# Patient Record
Sex: Male | Born: 1980 | Race: White | Hispanic: No | Marital: Single | Smoking: Former smoker
Health system: Southern US, Community
[De-identification: ages and names within clinical notes are randomized; demographics above are authoritative.]

## PROBLEM LIST (undated history)

## (undated) DIAGNOSIS — E669 Obesity, unspecified: Secondary | ICD-10-CM

## (undated) DIAGNOSIS — M1990 Unspecified osteoarthritis, unspecified site: Secondary | ICD-10-CM

## (undated) HISTORY — PX: FRACTURE SURGERY: SHX138

## (undated) HISTORY — PX: KNEE ARTHROSCOPY: SHX127

---
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
## 2008-10-18 HISTORY — PX: ANKLE FRACTURE SURGERY: SHX122

## 2015-01-21 ENCOUNTER — Emergency Department (HOSPITAL_COMMUNITY): Payer: Commercial Managed Care - HMO

## 2015-01-21 ENCOUNTER — Encounter (HOSPITAL_COMMUNITY): Payer: Self-pay | Admitting: Emergency Medicine

## 2015-01-21 ENCOUNTER — Other Ambulatory Visit (HOSPITAL_COMMUNITY): Payer: Self-pay

## 2015-01-21 ENCOUNTER — Inpatient Hospital Stay (HOSPITAL_COMMUNITY)
Admission: EM | Admit: 2015-01-21 | Discharge: 2015-01-24 | DRG: 871 | Disposition: A | Discharge status: Home / Self Care | Payer: Commercial Managed Care - HMO | Attending: Family Medicine | Admitting: Family Medicine

## 2015-01-21 DIAGNOSIS — Z720 Tobacco use: Secondary | ICD-10-CM | POA: Diagnosis not present

## 2015-01-21 DIAGNOSIS — R0602 Shortness of breath: Secondary | ICD-10-CM | POA: Diagnosis present

## 2015-01-21 DIAGNOSIS — Z7982 Long term (current) use of aspirin: Secondary | ICD-10-CM

## 2015-01-21 DIAGNOSIS — A419 Sepsis, unspecified organism: Principal | ICD-10-CM | POA: Diagnosis present

## 2015-01-21 DIAGNOSIS — J209 Acute bronchitis, unspecified: Secondary | ICD-10-CM | POA: Diagnosis not present

## 2015-01-21 DIAGNOSIS — F17210 Nicotine dependence, cigarettes, uncomplicated: Secondary | ICD-10-CM | POA: Diagnosis present

## 2015-01-21 DIAGNOSIS — R05 Cough: Secondary | ICD-10-CM

## 2015-01-21 DIAGNOSIS — Z21 Asymptomatic human immunodeficiency virus [HIV] infection status: Secondary | ICD-10-CM | POA: Diagnosis present

## 2015-01-21 DIAGNOSIS — J9601 Acute respiratory failure with hypoxia: Secondary | ICD-10-CM | POA: Diagnosis present

## 2015-01-21 DIAGNOSIS — R059 Cough, unspecified: Secondary | ICD-10-CM | POA: Diagnosis present

## 2015-01-21 DIAGNOSIS — R778 Other specified abnormalities of plasma proteins: Secondary | ICD-10-CM | POA: Diagnosis present

## 2015-01-21 DIAGNOSIS — T380X5A Adverse effect of glucocorticoids and synthetic analogues, initial encounter: Secondary | ICD-10-CM | POA: Diagnosis present

## 2015-01-21 DIAGNOSIS — J189 Pneumonia, unspecified organism: Secondary | ICD-10-CM

## 2015-01-21 DIAGNOSIS — I10 Essential (primary) hypertension: Secondary | ICD-10-CM | POA: Diagnosis present

## 2015-01-21 DIAGNOSIS — Z6841 Body Mass Index (BMI) 40.0 and over, adult: Secondary | ICD-10-CM

## 2015-01-21 DIAGNOSIS — R7989 Other specified abnormal findings of blood chemistry: Secondary | ICD-10-CM | POA: Diagnosis not present

## 2015-01-21 DIAGNOSIS — L02214 Cutaneous abscess of groin: Secondary | ICD-10-CM | POA: Diagnosis present

## 2015-01-21 DIAGNOSIS — R739 Hyperglycemia, unspecified: Secondary | ICD-10-CM

## 2015-01-21 DIAGNOSIS — J219 Acute bronchiolitis, unspecified: Secondary | ICD-10-CM | POA: Diagnosis present

## 2015-01-21 DIAGNOSIS — I248 Other forms of acute ischemic heart disease: Secondary | ICD-10-CM | POA: Diagnosis present

## 2015-01-21 DIAGNOSIS — L732 Hidradenitis suppurativa: Secondary | ICD-10-CM | POA: Diagnosis present

## 2015-01-21 DIAGNOSIS — R609 Edema, unspecified: Secondary | ICD-10-CM

## 2015-01-21 DIAGNOSIS — J40 Bronchitis, not specified as acute or chronic: Secondary | ICD-10-CM | POA: Diagnosis present

## 2015-01-21 HISTORY — DX: Obesity, unspecified: E66.9

## 2015-01-21 HISTORY — DX: Unspecified osteoarthritis, unspecified site: M19.90

## 2015-01-21 LAB — TROPONIN I
Troponin I: 0.05 ng/mL — ABNORMAL HIGH (ref <0.031)
Troponin I: 0.04 ng/mL — ABNORMAL HIGH (ref <0.031)
Troponin I: 0.03 ng/mL (ref <0.031)
Troponin I: 0.03 ng/mL (ref <0.031)

## 2015-01-21 LAB — CBC WITH DIFFERENTIAL/PLATELET
BASOS ABS: 0 10*3/uL (ref 0.0–0.1)
Basophils Relative: 0 % (ref 0–1)
Eosinophils Absolute: 0 10*3/uL (ref 0.0–0.7)
Eosinophils Relative: 0 % (ref 0–5)
HCT: 46.2 % (ref 39.0–52.0)
Hemoglobin: 15.3 g/dL (ref 13.0–17.0)
LYMPHS PCT: 5 % — AB (ref 12–46)
Lymphs Abs: 1 10*3/uL (ref 0.7–4.0)
MCH: 31.4 pg (ref 26.0–34.0)
MCHC: 33.1 g/dL (ref 30.0–36.0)
MCV: 94.7 fL (ref 78.0–100.0)
Monocytes Absolute: 1.1 10*3/uL — ABNORMAL HIGH (ref 0.1–1.0)
Monocytes Relative: 5 % (ref 3–12)
NEUTROS PCT: 90 % — AB (ref 43–77)
Neutro Abs: 19.5 10*3/uL — ABNORMAL HIGH (ref 1.7–7.7)
PLATELETS: 277 10*3/uL (ref 150–400)
RBC: 4.88 MIL/uL (ref 4.22–5.81)
RDW: 12.9 % (ref 11.5–15.5)
WBC: 21.7 10*3/uL — ABNORMAL HIGH (ref 4.0–10.5)

## 2015-01-21 LAB — RAPID URINE DRUG SCREEN, HOSP PERFORMED
Amphetamines: NOT DETECTED
BENZODIAZEPINES: NOT DETECTED
Barbiturates: NOT DETECTED
COCAINE: NOT DETECTED
Opiates: NOT DETECTED
Tetrahydrocannabinol: NOT DETECTED

## 2015-01-21 LAB — BASIC METABOLIC PANEL
Anion gap: 4 — ABNORMAL LOW (ref 5–15)
BUN: 8 mg/dL (ref 6–23)
CHLORIDE: 97 mmol/L (ref 96–112)
CO2: 34 mmol/L — AB (ref 19–32)
Calcium: 8.9 mg/dL (ref 8.4–10.5)
Creatinine, Ser: 0.81 mg/dL (ref 0.50–1.35)
GFR calc Af Amer: >90 mL/min (ref >90)
GFR calc non Af Amer: >90 mL/min (ref >90)
Glucose, Bld: 146 mg/dL — ABNORMAL HIGH (ref 70–99)
Potassium: 4.4 mmol/L (ref 3.5–5.1)
Sodium: 135 mmol/L (ref 135–145)

## 2015-01-21 LAB — PROTIME-INR
INR: 1.22 (ref 0.00–1.49)
PROTHROMBIN TIME: 15.6 s — AB (ref 11.6–15.2)

## 2015-01-21 LAB — D-DIMER, QUANTITATIVE: D-Dimer, Quant: 0.42 ug/mL-FEU (ref 0.00–0.48)

## 2015-01-21 LAB — INFLUENZA PANEL BY PCR (TYPE A & B)
H1N1FLUPCR: NOT DETECTED
INFLBPCR: NEGATIVE
Influenza A By PCR: NEGATIVE

## 2015-01-21 LAB — BRAIN NATRIURETIC PEPTIDE: B NATRIURETIC PEPTIDE 5: 71.1 pg/mL (ref 0.0–100.0)

## 2015-01-21 LAB — I-STAT CG4 LACTIC ACID, ED: LACTIC ACID, VENOUS: 1.96 mmol/L (ref 0.5–2.0)

## 2015-01-21 MED ORDER — LEVALBUTEROL HCL 1.25 MG/0.5ML IN NEBU
1.2500 mg | INHALATION_SOLUTION | Freq: Four times a day (QID) | RESPIRATORY_TRACT | Status: DC | PRN
  Filled 2015-01-21: qty 0.5

## 2015-01-21 MED ORDER — SODIUM CHLORIDE 0.9 % IJ SOLN
3.0000 mL | Freq: Two times a day (BID) | INTRAMUSCULAR | Status: DC
  Administered 2015-01-21: 3 mL via INTRAVENOUS

## 2015-01-21 MED ORDER — DM-GUAIFENESIN ER 30-600 MG PO TB12
1.0000 | ORAL_TABLET | Freq: Two times a day (BID) | ORAL | Status: DC
  Administered 2015-01-21 – 2015-01-24 (×7): 1 via ORAL
  Filled 2015-01-21 (×9): qty 1

## 2015-01-21 MED ORDER — ACETAMINOPHEN 325 MG PO TABS: 650.0000 mg | ORAL_TABLET | Freq: Four times a day (QID) | ORAL | Status: DC | PRN

## 2015-01-21 MED ORDER — IPRATROPIUM-ALBUTEROL 0.5-2.5 (3) MG/3ML IN SOLN
3.0000 mL | RESPIRATORY_TRACT | Status: DC
  Administered 2015-01-21 – 2015-01-23 (×14): 3 mL via RESPIRATORY_TRACT
  Filled 2015-01-21 (×14): qty 3

## 2015-01-21 MED ORDER — ASPIRIN EC 81 MG PO TBEC
81.0000 mg | DELAYED_RELEASE_TABLET | Freq: Every day | ORAL | Status: DC
  Administered 2015-01-21 – 2015-01-24 (×4): 81 mg via ORAL
  Filled 2015-01-21 (×4): qty 1

## 2015-01-21 MED ORDER — ONDANSETRON HCL 4 MG/2ML IJ SOLN: 4.0000 mg | Freq: Four times a day (QID) | INTRAMUSCULAR | Status: DC | PRN

## 2015-01-21 MED ORDER — METHYLPREDNISOLONE SODIUM SUCC 125 MG IJ SOLR
125.0000 mg | Freq: Once | INTRAMUSCULAR | Status: AC
  Administered 2015-01-21: 125 mg via INTRAVENOUS
  Filled 2015-01-21: qty 2

## 2015-01-21 MED ORDER — ALUM & MAG HYDROXIDE-SIMETH 200-200-20 MG/5ML PO SUSP: 30.0000 mL | Freq: Four times a day (QID) | ORAL | Status: DC | PRN

## 2015-01-21 MED ORDER — ALBUTEROL SULFATE (2.5 MG/3ML) 0.083% IN NEBU: 2.5000 mg | INHALATION_SOLUTION | RESPIRATORY_TRACT | Status: AC | PRN

## 2015-01-21 MED ORDER — HEPARIN SODIUM (PORCINE) 5000 UNIT/ML IJ SOLN
5000.0000 [IU] | Freq: Three times a day (TID) | INTRAMUSCULAR | Status: DC
  Administered 2015-01-21 – 2015-01-24 (×9): 5000 [IU] via SUBCUTANEOUS
  Filled 2015-01-21 (×11): qty 1

## 2015-01-21 MED ORDER — IPRATROPIUM-ALBUTEROL 0.5-2.5 (3) MG/3ML IN SOLN
3.0000 mL | Freq: Once | RESPIRATORY_TRACT | Status: AC
  Administered 2015-01-21: 3 mL via RESPIRATORY_TRACT
  Filled 2015-01-21: qty 3

## 2015-01-21 MED ORDER — ACETAMINOPHEN 650 MG RE SUPP: 650.0000 mg | Freq: Four times a day (QID) | RECTAL | Status: DC | PRN

## 2015-01-21 MED ORDER — SODIUM CHLORIDE 0.9 % IV SOLN
INTRAVENOUS | Status: DC
  Administered 2015-01-21: 75 mL/h via INTRAVENOUS
  Administered 2015-01-22: 1000 mL via INTRAVENOUS
  Administered 2015-01-22 – 2015-01-24 (×2): via INTRAVENOUS

## 2015-01-21 MED ORDER — ONDANSETRON HCL 4 MG PO TABS: 4.0000 mg | ORAL_TABLET | Freq: Four times a day (QID) | ORAL | Status: DC | PRN

## 2015-01-21 MED ORDER — HYDRALAZINE HCL 20 MG/ML IJ SOLN
10.0000 mg | Freq: Four times a day (QID) | INTRAMUSCULAR | Status: DC | PRN
  Administered 2015-01-23: 10 mg via INTRAVENOUS
  Filled 2015-01-21: qty 1

## 2015-01-21 MED ORDER — DOXYCYCLINE HYCLATE 100 MG PO TABS
100.0000 mg | ORAL_TABLET | Freq: Two times a day (BID) | ORAL | Status: DC
  Administered 2015-01-21 – 2015-01-22 (×4): 100 mg via ORAL
  Filled 2015-01-21 (×6): qty 1

## 2015-01-21 MED ORDER — NICOTINE 21 MG/24HR TD PT24
21.0000 mg | MEDICATED_PATCH | Freq: Every day | TRANSDERMAL | Status: DC
  Filled 2015-01-21 (×4): qty 1

## 2015-01-21 MED ORDER — METHYLPREDNISOLONE SODIUM SUCC 125 MG IJ SOLR
60.0000 mg | Freq: Three times a day (TID) | INTRAMUSCULAR | Status: DC
  Administered 2015-01-21 – 2015-01-22 (×3): 60 mg via INTRAVENOUS
  Filled 2015-01-21 (×2): qty 2
  Filled 2015-01-21: qty 0.96
  Filled 2015-01-21: qty 2
  Filled 2015-01-21 (×2): qty 0.96

## 2015-01-21 NOTE — ED Notes (Signed)
Glick at bedside.  

## 2015-01-21 NOTE — ED Notes (Addendum)
Attempted report 

## 2015-01-21 NOTE — Progress Notes (Signed)
Switched pt to  at 6L to see how he would tolerate. Pt denies SOB at this time and states he is ready to go home. He states he smokes 1 pk a day and is "always wheezy".

## 2015-01-21 NOTE — H&P (Signed)
[facility] History and Physical  Christian Woods WUJ:[id number] DOB: 01/25/1981 DOA: 01/21/2015  Referring physician: ED physician PCP: Pcp Not In System  Specialists:   Chief Complaint: dry cough and SOB  HPI: Christian Woods is a [age] y.o. male with past medical history of tobacco abuse, who presents with dry cough and shortness of breath.  Patient reports and he started having dry cough and shortness of breath since yesterday morning. He also has wheezing, no fever, chills, chest pain. He took Delsym and Zyrtec-D which stopped the cough, but he has developed a severe dyspnea. Dyspnea is worse with laying flat and with exertion.   Patient denies fever, chills, fatigue, headaches, chest pain, abdominal pain, diarrhea, constipation, dysuria, urgency, frequency, hematuria, skin rashes, joint pain. He reports having ankle edema from recent traveling. He drove to [place] and drove back two weeks ago. No tenderness over the calf areas. No unilateral weakness, numbness or tingling sensations. No vision change or hearing loss.  In ED, patient was found to have negative chest x-ray for acute abnormalities. Lactate 1.96, troponin elevation 0.05, BNP is 71.1.Temperature normal. Patient is admitted to inpatient for further evaluation and treatment.  Review of Systems: As presented in the history of presenting illness, rest negative.  Where does patient live?  At home Can patient participate in ADLs? Yes  Allergy: No Known Allergies  History reviewed. No pertinent past medical history.  Past Surgical History  Procedure Laterality Date  . Knee surgery    . Ankle surgery      Social History:  reports that he has been smoking Cigarettes.  He has been smoking about 1.50 packs per day. He does not have any smokeless tobacco history on file. He reports that he drinks about 21.6 oz of alcohol per week. He reports that he does not use illicit drugs.  Family History:  Family History  Problem  Relation Age of Onset  . Breast cancer    . Hypertension Mother      Prior to Admission medications   Medication Sig Start Date End Date Taking? Authorizing Provider  ibuprofen (ADVIL,MOTRIN) 200 MG tablet Take 400 mg by mouth every 6 (six) hours as needed for fever or moderate pain.   Yes Historical Provider, MD    Physical Exam: Filed Vitals:   01/21/15 0315 01/21/15 0328 01/21/15 0330 01/21/15 0454  BP: 177/100  168/98 160/94  Pulse: 131 124 123 113  Temp:      TempSrc:      Resp: 34 35 27 18  Height:      Weight:      SpO2: 96% 95% 95% 95%   General: Not in acute distress HEENT:       Eyes: PERRL, EOMI, no scleral icterus       ENT: No discharge from the ears and nose, no pharynx injection, no tonsillar enlargement.        Neck: No JVD, no bruit, no mass felt. Cardiac: S1/S2, RRR, No murmurs, No gallops or rubs Pulm: decreased air movement bilaterally. Diffused wheezing bilaterally, no rales. Abd: Soft, nondistended, nontender, no rebound pain, no organomegaly, BS present Ext: No edema bilaterally. 2+DP/PT pulse bilaterally Musculoskeletal: No joint deformities, erythema, or stiffness, ROM full Skin: No rashes.  Neuro: Alert and oriented X3, cranial nerves II-XII grossly intact, muscle strength 5/5 in all extremeties, sensation to light touch intact.  Psych: Patient is not psychotic, no suicidal or hemocidal ideation.  Labs on Admission:  Basic Metabolic Panel:  Recent Labs Lab  01/21/15 0323  NA 135  K 4.4  CL 97  CO2 34*  GLUCOSE 146*  BUN 8  CREATININE 0.81  CALCIUM 8.9   Liver Function Tests: No results for input(s): AST, ALT, ALKPHOS, BILITOT, PROT, ALBUMIN in the last 168 hours. No results for input(s): LIPASE, AMYLASE in the last 168 hours. No results for input(s): AMMONIA in the last 168 hours. CBC:  Recent Labs Lab 01/21/15 0323  WBC 21.7*  NEUTROABS 19.5*  HGB 15.3  HCT 46.2  MCV 94.7  PLT 277   Cardiac Enzymes:  Recent Labs Lab  01/21/15 0323  TROPONINI 0.05*    BNP (last 3 results)  Recent Labs  01/21/15 0323  BNP 71.1    ProBNP (last 3 results) No results for input(s): PROBNP in the last 8760 hours.  CBG: No results for input(s): GLUCAP in the last 168 hours.  Radiological Exams on Admission: Dg Chest Portable 1 View  01/21/2015   CLINICAL DATA:  Shortness of breath, fever, tachycardia, history smoking  EXAM: PORTABLE CHEST - 1 VIEW  COMPARISON:  Portable exam 0320 hr without priors for comparison.  FINDINGS: Normal heart size, mediastinal contours, and pulmonary vascularity.  Lungs clear.  No pleural effusion or pneumothorax.  Osseous structures unremarkable.  IMPRESSION: No acute abnormalities.   Electronically Signed   By: Ulyses Southward M.D.   On: 01/21/2015 03:32    EKG: Independently reviewed.   Assessment/Plan Principal Problem:   Acute bronchitis with bronchospasm Active Problems:   SOB (shortness of breath)   Cough   Bronchitis   Tobacco abuse   Elevated troponin I level  Acute bronchitis: Patient's cough and shortness of breath are most likely caused by acute bronchitis. Given recent history of prolonged recent traveling, will rule out pulmonary embolism though less likely given not any chest pain. -will admit to tele bed -Nebulizer: Xopenex every 6 hours when necessary, DuoNeb every 4 hours -solumedrol: 60 mg q8h -Mucinex for cough  -Urine drug screen, HIV -respiratory virus panel, Flu pcr -check D-dimer to r/o PE  Elevated trop: trop is 0.05. No chest pain. EKG has no ischemic change. It is most likely due to demanding ischemia secondary to tachycardia. -Troponin 3 -Aspirin 81 mg daily -repeat EKG.  Tobacco abuse: -Nicotine patch   DVT ppx: SQ Heparin         Code Status: Full code Family Communication: None at bed side.            Disposition Plan: Admit to inpatient   Date of Service 01/21/2015    Lorretta Harp [facility] Pager [phone]  If 7PM-7AM, please  contact night-coverage [email or web address] Password [facility] 01/21/2015, 5:36 AM

## 2015-01-21 NOTE — ED Notes (Signed)
Per EMS: woke up yesterday am, had a lot of non prod cough.  Bout 1230 he took zyrtic D, at 1500 he had difficulty breathing.  At 2200 he felt extremely short of breath, breaking out in sweat, never had pain.  On EMS arrival, FD had patient on NRB 15 L and patient was 96%, expiratory wheezing.  260/142, no hx of hypertension.  Has had high blood pressure before when taking decongestants.  Has had total 10 mg alb, 0.5 Atrovent.  Patient feels like breathing is better, talking in complete sentences, still wheezing.  spo2 94 % on nebulizer.. No med hx, no allergies, does not take any meds.

## 2015-01-21 NOTE — Progress Notes (Signed)
[place], will await for pt. To arrive to 5W31 from ED.  Forbes Cellarelcine Syan Cullimore, RN

## 2015-01-21 NOTE — ED Notes (Signed)
Ordered regular breakfast tray for pt.  

## 2015-01-21 NOTE — ED Notes (Signed)
Admitting MD states that parameters are 90% and above for spO2. Pt on 6L at 92% at this time.

## 2015-01-21 NOTE — ED Provider Notes (Signed)
CSN: [id number]     Arrival date & time 01/21/15  0254 History   First MD Initiated Contact with Patient 01/21/15 0310     Chief Complaint  Patient presents with  . Shortness of Breath     (Consider location/radiation/quality/duration/timing/severity/associated sxs/prior Treatment) Patient is a 34 y.o. male presenting with shortness of breath. The history is provided by the patient.  Shortness of Breath He started having a cough this morning. Cough was nonproductive. He took Delsym and Zyrtec-D which stopped the cough, but he has developed a severe dyspnea. Dyspnea is worse with laying flat and with exertion. He denies chest pain, heaviness, tightness, pressure. He denies fever, chills, sweats. He was brought in by ambulance where he was given albuterol and ipratropium via nebulizer with partial improvement. He states he is feeling better although he is noted to still be dyspneic. He is a one pack a day smoker, and admits to history of bronchitis in the past but has never had to be hospitalized for that.  History reviewed. No pertinent past medical history. Past Surgical History  Procedure Laterality Date  . Knee surgery    . Ankle surgery     No family history on file. History  Substance Use Topics  . Smoking status: Heavy Tobacco Smoker -- 1.50 packs/day    Types: Cigarettes  . Smokeless tobacco: Not on file  . Alcohol Use: 21.6 oz/week    36 Cans of beer per week    Review of Systems  Respiratory: Positive for shortness of breath.   All other systems reviewed and are negative.     Allergies  Review of patient's allergies indicates no known allergies.  Home Medications   Prior to Admission medications   Not on File   BP 220/129 mmHg  Pulse 129  Temp(Src) 98.7 F (37.1 C) (Oral)  Resp 32  Ht  (1.778 m)  Wt 270 lb (122.471 kg)  BMI 38.74 kg/m2  SpO2 94% Physical Exam  Nursing note and vitals reviewed.  34 year old male, resting comfortably and in no acute  distress. Vital signs are significant for tachycardia, tachypnea, and hypertension. Oxygen saturation is 94%, which is normal. Head is normocephalic and atraumatic. PERRLA, EOMI. Oropharynx is clear. Neck is nontender and supple without adenopathy or JVD. Back is nontender and there is no CVA tenderness. Lungs have diminished air flow with diffuse expiratory wheezes. There are no rales or rhonchi. There is no use of accessory muscles of respiration. Chest is nontender. Heart has regular rate and rhythm without murmur. Abdomen is soft, flat, nontender without masses or hepatosplenomegaly and peristalsis is normoactive. Extremities have 1+ edema, full range of motion is present. Skin is warm and dry without rash. Neurologic: Mental status is normal, cranial nerves are intact, there are no motor or sensory deficits.  ED Course  Procedures (including critical care time) Labs Review Results for orders placed or performed during the hospital encounter of 01/21/15  Basic metabolic panel  Result Value Ref Range   Sodium 135 135 - 145 mmol/L   Potassium 4.4 3.5 - 5.1 mmol/L   Chloride 97 96 - 112 mmol/L   CO2 34 (H) 19 - 32 mmol/L   Glucose, Bld 146 (H) 70 - 99 mg/dL   BUN 8 6 - 23 mg/dL   Creatinine, Ser 4.09 0.50 - 1.35 mg/dL   Calcium 8.9 8.4 - 81.1 mg/dL   GFR calc non Af Amer >90 >90 mL/min   GFR calc Af Amer >90 >  90 mL/min   Anion gap 4 (L) 5 - 15  Brain natriuretic peptide  Result Value Ref Range   B Natriuretic Peptide 71.1 0.0 - 100.0 pg/mL  Troponin I  Result Value Ref Range   Troponin I 0.05 (H) <0.031 ng/mL  CBC with Differential  Result Value Ref Range   WBC 21.7 (H) 4.0 - 10.5 K/uL   RBC 4.88 4.22 - 5.81 MIL/uL   Hemoglobin 15.3 13.0 - 17.0 g/dL   HCT 40.9 81.1 - 91.4 %   MCV 94.7 78.0 - 100.0 fL   MCH 31.4 26.0 - 34.0 pg   MCHC 33.1 30.0 - 36.0 g/dL   RDW 78.2 95.6 - 21.3 %   Platelets 277 150 - 400 K/uL   Neutrophils Relative % 90 (H) 43 - 77 %   Neutro Abs 19.5  (H) 1.7 - 7.7 K/uL   Lymphocytes Relative 5 (L) 12 - 46 %   Lymphs Abs 1.0 0.7 - 4.0 K/uL   Monocytes Relative 5 3 - 12 %   Monocytes Absolute 1.1 (H) 0.1 - 1.0 K/uL   Eosinophils Relative 0 0 - 5 %   Eosinophils Absolute 0.0 0.0 - 0.7 K/uL   Basophils Relative 0 0 - 1 %   Basophils Absolute 0.0 0.0 - 0.1 K/uL  I-Stat CG4 Lactic Acid, ED  Result Value Ref Range   Lactic Acid, Venous 1.96 0.5 - 2.0 mmol/L   Imaging Review Dg Chest Portable 1 View  01/21/2015   CLINICAL DATA:  Shortness of breath, fever, tachycardia, history smoking  EXAM: PORTABLE CHEST - 1 VIEW  COMPARISON:  Portable exam 0320 hr without priors for comparison.  FINDINGS: Normal heart size, mediastinal contours, and pulmonary vascularity.  Lungs clear.  No pleural effusion or pneumothorax.  Osseous structures unremarkable.  IMPRESSION: No acute abnormalities.   Electronically Signed   By: Ulyses Southward M.D.   On: 01/21/2015 03:32     EKG Interpretation   Date/Time:  Tuesday January 21 2015 03:11:38 EDT Ventricular Rate:  129 PR Interval:  107 QRS Duration: 87 QT Interval:  328 QTC Calculation: 480 R Axis:   76 Text Interpretation:  Sinus tachycardia Borderline T abnormalities,  diffuse leads Borderline prolonged QT interval No old tracing to compare  Confirmed by [facility]  MD, Raziya Aveni ([id number]) on 01/21/2015 3:19:35 AM      CRITICAL CARE Performed by: QIONG,EXBMW Total critical care time: 45 minutes Critical care time was exclusive of separately billable procedures and treating other patients. Critical care was necessary to treat or prevent imminent or life-threatening deterioration. Critical care was time spent personally by me on the following activities: development of treatment plan with patient and/or surrogate as well as nursing, discussions with consultants, evaluation of patient's response to treatment, examination of patient, obtaining history from patient or surrogate, ordering and performing treatments and  interventions, ordering and review of laboratory studies, ordering and review of radiographic studies, pulse oximetry and re-evaluation of patient's condition.  MDM   Final diagnoses:  Acute bronchiolitis with bronchospasm  Hyperglycemia  Elevated troponin I level    Cough with bronchospasm, rule out pneumonia. Chest x-ray has been ordered and he will be given an additional albuterol with ipratropium nebulizer treatment as well as a dose of methylprednisolone. It is noted that he has peripheral edema. BNP will be checked. There are no prior records and the [facility].  There is only modest improvement with additional albuterol with ipratropium. WBC is markedly elevated, but there  is no obvious source of infection. Bone and is slightly elevated and this is felt to represent demand ischemia. No evidence of acute STEMI. Case is discussed with Dr. Clyde LundborgNiu [facility] who agrees to admit the patient.  Dione Boozeavid Izola Teague, MD 01/21/15 [id number]

## 2015-01-21 NOTE — Plan of Care (Signed)
Problem: Phase I Progression Outcomes Goal: Initial discharge plan identified Outcome: Completed/Met Date Met:  01/21/15 To return home alone

## 2015-01-21 NOTE — Progress Notes (Addendum)
Patient admitted after midnight, please see H&P. Acute bronchitis: Patient's cough and shortness of breath are most likely caused by acute bronchitis from smoking -Nebulizer: Xopenex every 6 hours when necessary, DuoNeb every 4 hours -solumedrol: 60 mg q8h -Mucinex for cough  -Urine drug screen neg, HIV - D-dimer ok  Elevated trop: trop is 0.05. No chest pain. EKG has no ischemic change. It is most likely due to demanding ischemia secondary to tachycardia. -Troponin 3 -Aspirin 81 mg daily  Tobacco abuse: -Nicotine patch- patient not interested  Chronic groin mass- chronic hidradenitis- 3 weeks of doxy Large right side- patient says drains WBC count  Morbid obesity- encourage weight loss  Sinus tach  Marlin CanaryJessica Telma Pyeatt DO

## 2015-01-21 NOTE — Consult Note (Signed)
[facility] Surgery Consult Note  Christian Woods Apr 28, 1981  [id number]  Requesting MD: Dr. Eliseo Squires Chief Complaint/Reason for Consult: Groin abscesses  HPI:  34 y/o white male present to [facility] with dry cough, SOB and chronic draining groin abscesses.  We were asked to see him for his groin abscesses.   He's had trouble with them since high school.  He gets the abscesses in his groin, axilla, buttock, perineum, and posterior neck.  He says the start small gradually enlarge and eventually the rupture on their own.  He often has to puncture them with a needle and squeeze the pus out as "a last ditch effort" and to prevent it rupturing it while he's at work.  No N/V, no abdominal pain, fever/chills, no trouble urinating or having BM's.  He states he's concerned about this problem and wants resolution.  He's seen a few dermatologist in the past who diagnosed him with "severe acne".  He was started on accutane which helped some of the lesions, but not the big ones.  He's also been on antibiotics in the past.  Most recently saw Dr. Elvera Lennox a dermatologist in [place].  He c/o chronic swelling in his suprapubic region.  Patient notes pain only when abscesses enlarge and don't rupture.  No radiating pain, no perceived precipitating/alleviating factors.  Pt has a BMI of 38, no know DM.    ROS: All systems reviewed and otherwise negative except for as above  Family History  Problem Relation Age of Onset  . Breast cancer    . Hypertension Mother     History reviewed. No pertinent past medical history.  Past Surgical History  Procedure Laterality Date  . Knee surgery    . Ankle surgery      Social History:  reports that he has been smoking Cigarettes.  He has been smoking about 1.50 packs per day. He does not have any smokeless tobacco history on file. He reports that he drinks about 21.6 oz of alcohol per week. He reports that he does not use illicit drugs.  Allergies: No Known  Allergies   (Not in a hospital admission)  Blood pressure 159/94, pulse 112, temperature 99.2 F (37.3 C), temperature source Oral, resp. rate 18, height 5' 10"  (1.778 m), weight 122.471 kg (270 lb), SpO2 92 %. Physical Exam: General: pleasant, WD/WN white male who is laying in bed in NAD HEENT: head is normocephalic, atraumatic.  Sclera are noninjected.  PERRL.  Ears and nose without any masses or lesions.  Mouth is pink and moist Heart: regular, rate, and rhythm.  No obvious murmurs, gallops, or rubs noted.  Palpable pedal pulses bilaterally Lungs: CTAB, no wheezes, rhonchi, or rales noted.  Respiratory effort nonlabored Abd: soft, NT/ND, +BS, no masses, hernias, or organomegaly MS: all 4 extremities are symmetrical with no cyanosis, clubbing, or edema. Skin: warm and dry, multiple abscesses in the b/l groin, with significant chronic skin changes b/l groins, multiple draining abscesses, no significant un-drained abscesses, minor erythema Psych: A&Ox3 with an appropriate affect.   Results for orders placed or performed during the hospital encounter of 01/21/15 (from the past 48 hour(s))  Basic metabolic panel     Status: Abnormal   Collection Time: 01/21/15  3:23 AM  Result Value Ref Range   Sodium 135 135 - 145 mmol/L   Potassium 4.4 3.5 - 5.1 mmol/L   Chloride 97 96 - 112 mmol/L   CO2 34 (H) 19 - 32 mmol/L   Glucose, Bld 146 (H) 70 -  99 mg/dL   BUN 8 6 - 23 mg/dL   Creatinine, Ser 0.81 0.50 - 1.35 mg/dL   Calcium 8.9 8.4 - 10.5 mg/dL   GFR calc non Af Amer >90 >90 mL/min   GFR calc Af Amer >90 >90 mL/min    Comment: (NOTE) The eGFR has been calculated using the CKD EPI equation. This calculation has not been validated in all clinical situations. eGFR's persistently <90 mL/min signify possible Chronic Kidney Disease.    Anion gap 4 (L) 5 - 15  Brain natriuretic peptide     Status: None   Collection Time: 01/21/15  3:23 AM  Result Value Ref Range   B Natriuretic Peptide  71.1 0.0 - 100.0 pg/mL  Troponin I     Status: Abnormal   Collection Time: 01/21/15  3:23 AM  Result Value Ref Range   Troponin I 0.05 (H) <0.031 ng/mL    Comment:        PERSISTENTLY INCREASED TROPONIN VALUES IN THE RANGE OF 0.04-0.49 ng/mL CAN BE SEEN IN:       -UNSTABLE ANGINA       -CONGESTIVE HEART FAILURE       -MYOCARDITIS       -CHEST TRAUMA       -ARRYHTHMIAS       -LATE PRESENTING MYOCARDIAL INFARCTION       -COPD   CLINICAL FOLLOW-UP RECOMMENDED.   CBC with Differential     Status: Abnormal   Collection Time: 01/21/15  3:23 AM  Result Value Ref Range   WBC 21.7 (H) 4.0 - 10.5 K/uL   RBC 4.88 4.22 - 5.81 MIL/uL   Hemoglobin 15.3 13.0 - 17.0 g/dL   HCT 46.2 39.0 - 52.0 %   MCV 94.7 78.0 - 100.0 fL   MCH 31.4 26.0 - 34.0 pg   MCHC 33.1 30.0 - 36.0 g/dL   RDW 12.9 11.5 - 15.5 %   Platelets 277 150 - 400 K/uL   Neutrophils Relative % 90 (H) 43 - 77 %   Neutro Abs 19.5 (H) 1.7 - 7.7 K/uL   Lymphocytes Relative 5 (L) 12 - 46 %   Lymphs Abs 1.0 0.7 - 4.0 K/uL   Monocytes Relative 5 3 - 12 %   Monocytes Absolute 1.1 (H) 0.1 - 1.0 K/uL   Eosinophils Relative 0 0 - 5 %   Eosinophils Absolute 0.0 0.0 - 0.7 K/uL   Basophils Relative 0 0 - 1 %   Basophils Absolute 0.0 0.0 - 0.1 K/uL  I-Stat CG4 Lactic Acid, ED     Status: None   Collection Time: 01/21/15  3:36 AM  Result Value Ref Range   Lactic Acid, Venous 1.96 0.5 - 2.0 mmol/L  Troponin I (q 6hr x 3)     Status: Abnormal   Collection Time: 01/21/15 11:23 AM  Result Value Ref Range   Troponin I 0.04 (H) <0.031 ng/mL    Comment:        PERSISTENTLY INCREASED TROPONIN VALUES IN THE RANGE OF 0.04-0.49 ng/mL CAN BE SEEN IN:       -UNSTABLE ANGINA       -CONGESTIVE HEART FAILURE       -MYOCARDITIS       -CHEST TRAUMA       -ARRYHTHMIAS       -LATE PRESENTING MYOCARDIAL INFARCTION       -COPD   CLINICAL FOLLOW-UP RECOMMENDED.   Protime-INR     Status: Abnormal   Collection Time: 01/21/15  11:23 AM  Result Value  Ref Range   Prothrombin Time 15.6 (H) 11.6 - 15.2 seconds   INR 1.22 0.00 - 1.49  D-dimer, quantitative     Status: None   Collection Time: 01/21/15 11:23 AM  Result Value Ref Range   D-Dimer, Quant 0.42 0.00 - 0.48 ug/mL-FEU    Comment:        AT THE INHOUSE ESTABLISHED CUTOFF VALUE OF 0.48 ug/mL FEU, THIS ASSAY HAS BEEN DOCUMENTED IN THE LITERATURE TO HAVE A SENSITIVITY AND NEGATIVE PREDICTIVE VALUE OF AT LEAST 98 TO 99%.  THE TEST RESULT SHOULD BE CORRELATED WITH AN ASSESSMENT OF THE CLINICAL PROBABILITY OF DVT / VTE.   Urine rapid drug screen (hosp performed)     Status: None   Collection Time: 01/21/15 12:36 PM  Result Value Ref Range   Opiates NONE DETECTED NONE DETECTED   Cocaine NONE DETECTED NONE DETECTED   Benzodiazepines NONE DETECTED NONE DETECTED   Amphetamines NONE DETECTED NONE DETECTED   Tetrahydrocannabinol NONE DETECTED NONE DETECTED   Barbiturates NONE DETECTED NONE DETECTED    Comment:        DRUG SCREEN FOR MEDICAL PURPOSES ONLY.  IF CONFIRMATION IS NEEDED FOR ANY PURPOSE, NOTIFY LAB WITHIN 5 DAYS.        LOWEST DETECTABLE LIMITS FOR URINE DRUG SCREEN Drug Class       Cutoff (ng/mL) Amphetamine      1000 Barbiturate      200 Benzodiazepine   197 Tricyclics       588 Opiates          300 Cocaine          300 THC              50    Dg Chest Portable 1 View  01/21/2015   CLINICAL DATA:  Shortness of breath, fever, tachycardia, history smoking  EXAM: PORTABLE CHEST - 1 VIEW  COMPARISON:  Portable exam 0320 hr without priors for comparison.  FINDINGS: Normal heart size, mediastinal contours, and pulmonary vascularity.  Lungs clear.  No pleural effusion or pneumothorax.  Osseous structures unremarkable.  IMPRESSION: No acute abnormalities.   Electronically Signed   By: Lavonia Dana M.D.   On: 01/21/2015 03:32    Assessment/Plan Chronic groin hidradenitis  -Chronic in nature, no acute un-drained abscesses, most abscesses are spontaneously  draining  -Doxycycline for 3 weeks to calm down the infection -Avoid popping abscesses with needles, hand hygiene, being cautions to avoid clothes contamination -Keep dressings on wounds while actively draining, change at least daily more often as needed for saturation -Wash with antibacterial soap/warm water, may need Hibiclens was 3x/week (he has this at home) -Hot compresses are okay  -Establish care with PCP, may need referral to general surgery (he may follow up with our office - but he needs a PCP) or plastics for chronic care (he's waiting on insurance approval through his job) -Discussed weight loss - diet/exercise needed, may need A1C if not done recently -No further surgical recommendations Acute bronchitis Elevated troponin's Tobacco abuse Alcohol use Leukocytosis   Coralie Keens, [facility] 01/21/2015, 2:39 PM Pager: [phone]  Agree with above.  Alphonsa Overall, MD, [facility] Surgery Pager: [phone] Office phone:  [phone]

## 2015-01-21 NOTE — Progress Notes (Signed)
Christian Woods [id number] Admitted to 5W 31: 01/21/2015 7:53 PM Attending Provider: Lorretta HarpXilin Niu, MD    Christian DarterLandon Woods is a 34 y.o. male patient admitted from ED awake, alert  & orientated  X 3,  Full Code, VSS - Blood pressure 153/101, pulse 120, temperature 98.1 F (36.7 Woods), temperature source Oral, resp. rate 20, height 5\' 10"  (1.778 m), weight 123.424 kg (272 lb 1.6 oz), SpO2 98 %., O2    6 L nasal cannular, no Woods/o shortness of breath, no Woods/o chest pain, no distress noted. Tele # 1 placed and pt is currently running:sinus tachycardia.   IV site WDL:  forearm right, condition patent and no redness with a transparent dsg that's clean dry and intact.  Allergies:  No Known Allergies   Past Medical History  Diagnosis Date  . Obesity (BMI 30-39.9)     BMI 38  . Arthritis     "right knee" (01/21/2015)    History:  obtained from the patient.  Pt orientation to unit, room and routine. Information packet given to patient/family and safety video watched.  Admission INP armband ID verified with patient/family, and in place. SR up x 2, fall risk assessment complete with Patient and family verbalizing understanding of risks associated with falls. Pt verbalizes an understanding of how to use the call bell and to call for help before getting out of bed.  Skin, clean-dry- intact without evidence of bruising, or skin tears.  Pt. With multiple boils on lower abd. And in right groin area, with open area to left lower abd., placed foam dressing.    Will cont to monitor and assist as needed.  Christian Woods, Christian Khamis C, RN 01/21/2015 7:53 PM

## 2015-01-22 ENCOUNTER — Observation Stay (HOSPITAL_COMMUNITY): Payer: Commercial Managed Care - HMO

## 2015-01-22 DIAGNOSIS — Z21 Asymptomatic human immunodeficiency virus [HIV] infection status: Secondary | ICD-10-CM | POA: Diagnosis present

## 2015-01-22 DIAGNOSIS — L732 Hidradenitis suppurativa: Secondary | ICD-10-CM | POA: Diagnosis present

## 2015-01-22 DIAGNOSIS — J219 Acute bronchiolitis, unspecified: Secondary | ICD-10-CM | POA: Diagnosis present

## 2015-01-22 DIAGNOSIS — F17210 Nicotine dependence, cigarettes, uncomplicated: Secondary | ICD-10-CM | POA: Diagnosis present

## 2015-01-22 DIAGNOSIS — J40 Bronchitis, not specified as acute or chronic: Secondary | ICD-10-CM | POA: Diagnosis present

## 2015-01-22 DIAGNOSIS — A419 Sepsis, unspecified organism: Secondary | ICD-10-CM | POA: Diagnosis present

## 2015-01-22 DIAGNOSIS — J9601 Acute respiratory failure with hypoxia: Secondary | ICD-10-CM | POA: Diagnosis present

## 2015-01-22 DIAGNOSIS — I248 Other forms of acute ischemic heart disease: Secondary | ICD-10-CM | POA: Diagnosis present

## 2015-01-22 DIAGNOSIS — J209 Acute bronchitis, unspecified: Secondary | ICD-10-CM | POA: Diagnosis present

## 2015-01-22 DIAGNOSIS — L02214 Cutaneous abscess of groin: Secondary | ICD-10-CM | POA: Diagnosis present

## 2015-01-22 DIAGNOSIS — Z6841 Body Mass Index (BMI) 40.0 and over, adult: Secondary | ICD-10-CM | POA: Diagnosis not present

## 2015-01-22 DIAGNOSIS — I10 Essential (primary) hypertension: Secondary | ICD-10-CM | POA: Diagnosis present

## 2015-01-22 DIAGNOSIS — Z7982 Long term (current) use of aspirin: Secondary | ICD-10-CM | POA: Diagnosis not present

## 2015-01-22 DIAGNOSIS — Z720 Tobacco use: Secondary | ICD-10-CM | POA: Diagnosis not present

## 2015-01-22 DIAGNOSIS — T380X5A Adverse effect of glucocorticoids and synthetic analogues, initial encounter: Secondary | ICD-10-CM | POA: Diagnosis present

## 2015-01-22 DIAGNOSIS — R0602 Shortness of breath: Secondary | ICD-10-CM | POA: Diagnosis present

## 2015-01-22 LAB — COMPREHENSIVE METABOLIC PANEL
ALT: 29 U/L (ref 0–53)
ANION GAP: 10 (ref 5–15)
AST: 23 U/L (ref 0–37)
Albumin: 3.3 g/dL — ABNORMAL LOW (ref 3.5–5.2)
Alkaline Phosphatase: 75 U/L (ref 39–117)
BUN: 10 mg/dL (ref 6–23)
CALCIUM: 8.9 mg/dL (ref 8.4–10.5)
CO2: 24 mmol/L (ref 19–32)
Chloride: 104 mmol/L (ref 96–112)
Creatinine, Ser: 0.71 mg/dL (ref 0.50–1.35)
GFR calc non Af Amer: >90 mL/min (ref >90)
Glucose, Bld: 173 mg/dL — ABNORMAL HIGH (ref 70–99)
Potassium: 4.7 mmol/L (ref 3.5–5.1)
SODIUM: 138 mmol/L (ref 135–145)
Total Bilirubin: 0.4 mg/dL (ref 0.3–1.2)
Total Protein: 7.8 g/dL (ref 6.0–8.3)

## 2015-01-22 LAB — CBC
HCT: 43.1 % (ref 39.0–52.0)
HEMOGLOBIN: 14 g/dL (ref 13.0–17.0)
MCH: 31 pg (ref 26.0–34.0)
MCHC: 32.5 g/dL (ref 30.0–36.0)
MCV: 95.6 fL (ref 78.0–100.0)
Platelets: 285 10*3/uL (ref 150–400)
RBC: 4.51 MIL/uL (ref 4.22–5.81)
RDW: 13.3 % (ref 11.5–15.5)
WBC: 19.1 10*3/uL — ABNORMAL HIGH (ref 4.0–10.5)

## 2015-01-22 LAB — GLUCOSE, CAPILLARY
Glucose-Capillary: 227 mg/dL — ABNORMAL HIGH (ref 70–99)
Glucose-Capillary: 248 mg/dL — ABNORMAL HIGH (ref 70–99)
Glucose-Capillary: 129 mg/dL — ABNORMAL HIGH (ref 70–99)

## 2015-01-22 LAB — PROCALCITONIN: Procalcitonin: 0.12 ng/mL

## 2015-01-22 LAB — HIV ANTIBODY (ROUTINE TESTING W REFLEX): HIV SCREEN 4TH GENERATION: NONREACTIVE

## 2015-01-22 LAB — LACTIC ACID, PLASMA: LACTIC ACID, VENOUS: 2.5 mmol/L — AB (ref 0.5–2.0)

## 2015-01-22 MED ORDER — LEVOFLOXACIN IN D5W 750 MG/150ML IV SOLN
750.0000 mg | INTRAVENOUS | Status: DC
  Administered 2015-01-23: 750 mg via INTRAVENOUS
  Filled 2015-01-22 (×3): qty 150

## 2015-01-22 MED ORDER — SODIUM CHLORIDE 0.9 % IV BOLUS (SEPSIS)
1000.0000 mL | Freq: Once | INTRAVENOUS | Status: AC
  Administered 2015-01-22: 1000 mL via INTRAVENOUS

## 2015-01-22 MED ORDER — METHYLPREDNISOLONE SODIUM SUCC 40 MG IJ SOLR
40.0000 mg | Freq: Three times a day (TID) | INTRAMUSCULAR | Status: DC
  Administered 2015-01-22 – 2015-01-23 (×4): 40 mg via INTRAVENOUS
  Filled 2015-01-22 (×6): qty 1

## 2015-01-22 MED ORDER — INSULIN ASPART 100 UNIT/ML ~~LOC~~ SOLN
0.0000 [IU] | Freq: Three times a day (TID) | SUBCUTANEOUS | Status: DC
  Administered 2015-01-22 (×2): 7 [IU] via SUBCUTANEOUS
  Administered 2015-01-23: 3 [IU] via SUBCUTANEOUS
  Administered 2015-01-23: 4 [IU] via SUBCUTANEOUS
  Administered 2015-01-23: 7 [IU] via SUBCUTANEOUS

## 2015-01-22 NOTE — Progress Notes (Signed)
UR completed 

## 2015-01-22 NOTE — Progress Notes (Signed)
PROGRESS NOTE  Scheryl DarterLandon Valeriano ZOX:[id number] DOB: 09/14/1981 DOA: 01/21/2015 PCP: No PCP Per Patient  HPI/Recap of past 24 hours: Patient is a 34 year old male with past mitral obesity and chronic tobacco abuse who was admitted on 4/5 after having several days of dry cough and dyspnea on exertion. In the emergency room chest x-ray unremarkable, but he was noted to be markedly hypoxic requiring supple and oxygen, lactic acid level I.96 and white blood cell count of 21. Patient started on nebulizers plus oxygen plus steroids plus doxycycline. Flu PCR negative.  Assessment/Plan: Principal Problem:   Sepsis causing acute respiratory failure with hypoxia secondary to bronchitis with bronchospasm: Patient is criteria with tachycardia, lactic acid level of 1.96 and markedly leukocytosis: We'll treat with IV Levaquin, nebulizers, supple oxygen and steroids. Ambulate patient off of oxygen today led to oxygen saturations of 86%.    Morbid obesity: Patient meets criteria with BMI greater than 40   Tobacco abuse: Patient declines nicotine patch   Elevated troponin I level: Elevated lab level secondary to respiratory issues. No evidence of cardiac involvement    Code Status: Full code  Family Communication: Mother at the bedside  Disposition Plan: Home once white count resolved and patient longer hypoxic   Consultants:  None  Procedures:  None  Antibiotics:  Doxycycline 4/5-4/6  IV Levaquin 4/6-present   Objective: BP 156/81 mmHg  Pulse 110  Temp(Src) 98.4 F (36.9 C) (Oral)  Resp 19  Ht 5\' 10"  (1.778 m)  Wt 123.424 kg (272 lb 1.6 oz)  BMI 39.04 kg/m2  SpO2 93%  Intake/Output Summary (Last 24 hours) at 01/22/15 1757 Last data filed at 01/22/15 1753  Gross per 24 hour  Intake 1296.25 ml  Output    350 ml  Net 946.25 ml   Filed Weights   01/21/15 0259 01/21/15 1625  Weight: 122.471 kg (270 lb) 123.424 kg (272 lb 1.6 oz)    Exam:   General:  Alert and oriented 3, no  acute distress  Cardiovascular: Regular rate and rhythm, S1-S2  Respiratory: Bilateral end expiratory wheeze  Abdomen: Soft, nontender, nondistended, positive bowel sounds  Musculoskeletal: trace pitting edema   Data Reviewed: Basic Metabolic Panel:  Recent Labs Lab 01/21/15 0323 01/22/15 0603  NA 135 138  K 4.4 4.7  CL 97 104  CO2 34* 24  GLUCOSE 146* 173*  BUN 8 10  CREATININE 0.81 0.71  CALCIUM 8.9 8.9   Liver Function Tests:  Recent Labs Lab 01/22/15 0603  AST 23  ALT 29  ALKPHOS 75  BILITOT 0.4  PROT 7.8  ALBUMIN 3.3*   No results for input(s): LIPASE, AMYLASE in the last 168 hours. No results for input(s): AMMONIA in the last 168 hours. CBC:  Recent Labs Lab 01/21/15 0323 01/22/15 0603  WBC 21.7* 19.1*  NEUTROABS 19.5*  --   HGB 15.3 14.0  HCT 46.2 43.1  MCV 94.7 95.6  PLT 277 285   Cardiac Enzymes:    Recent Labs Lab 01/21/15 0323 01/21/15 1123 01/21/15 1858 01/21/15 2219  TROPONINI 0.05* 0.04* 0.03 0.03   BNP (last 3 results)  Recent Labs  01/21/15 0323  BNP 71.1    ProBNP (last 3 results) No results for input(s): PROBNP in the last 8760 hours.  CBG:  Recent Labs Lab 01/22/15 1308 01/22/15 1722  GLUCAP 227* 248*    No results found for this or any previous visit (from the past 240 hour(s)).   Studies: Dg Chest Portable 1 View  01/21/2015  CLINICAL DATA:  Shortness of breath, fever, tachycardia, history smoking  EXAM: PORTABLE CHEST - 1 VIEW  COMPARISON:  Portable exam 0320 hr without priors for comparison.  FINDINGS: Normal heart size, mediastinal contours, and pulmonary vascularity.  Lungs clear.  No pleural effusion or pneumothorax.  Osseous structures unremarkable.  IMPRESSION: No acute abnormalities.   Electronically Signed   By: Ulyses Southward M.D.   On: 01/21/2015 03:32    Scheduled Meds: . aspirin EC  81 mg Oral Daily  . dextromethorphan-guaiFENesin  1 tablet Oral BID  . doxycycline  100 mg Oral Q12H  . heparin   5,000 Units Subcutaneous 3 times per day  . insulin aspart  0-20 Units Subcutaneous TID WC  . ipratropium-albuterol  3 mL Nebulization Q4H  . methylPREDNISolone (SOLU-MEDROL) injection  40 mg Intravenous 3 times per day  . nicotine  21 mg Transdermal Daily  . sodium chloride  3 mL Intravenous Q12H    Continuous Infusions: . sodium chloride 1,000 mL (01/22/15 1355)     Time spent: 35 minutes  Hollice Espy  [facility] Pager [phone]. If 7PM-7AM, please contact night-coverage at [email or web address], password [facility] 01/22/2015, 5:57 PM  LOS: 1 day

## 2015-01-22 NOTE — Progress Notes (Signed)
CRITICAL VALUE ALERT  Critical value received:  Lactic acid 2.5  Date of notification:  01/22/2015  Time of notification:  2155  Critical value read back:Yes.    Nurse who received alert:  Marlon Pelora gardiner  MD notified (1st page):  NP Craige CottaKirby  Time of first page:  2156  MD notified (2nd page):  Time of second page:  Responding MD:  NP has not respond as of now  Time MD responded:

## 2015-01-22 NOTE — Progress Notes (Signed)
SATURATION QUALIFICATIONS: (This note is used to comply with regulatory documentation for home oxygen)  Patient Saturations on Room Air at Rest = 89-90%  Patient Saturations on Room Air while Ambulating = 86%  Patient Saturations on 3 Liters of oxygen while Ambulating = 94%  Please briefly explain why patient needs home oxygen:

## 2015-01-23 LAB — CBC
HCT: 39.9 % (ref 39.0–52.0)
Hemoglobin: 12.8 g/dL — ABNORMAL LOW (ref 13.0–17.0)
MCH: 30.6 pg (ref 26.0–34.0)
MCHC: 32.1 g/dL (ref 30.0–36.0)
MCV: 95.5 fL (ref 78.0–100.0)
PLATELETS: 254 10*3/uL (ref 150–400)
RBC: 4.18 MIL/uL — ABNORMAL LOW (ref 4.22–5.81)
RDW: 13.5 % (ref 11.5–15.5)
WBC: 13.9 10*3/uL — AB (ref 4.0–10.5)

## 2015-01-23 LAB — BASIC METABOLIC PANEL
Anion gap: 9 (ref 5–15)
BUN: 13 mg/dL (ref 6–23)
CHLORIDE: 107 mmol/L (ref 96–112)
CO2: 22 mmol/L (ref 19–32)
Calcium: 8.4 mg/dL (ref 8.4–10.5)
Creatinine, Ser: 0.64 mg/dL (ref 0.50–1.35)
GFR calc Af Amer: >90 mL/min (ref >90)
GFR calc non Af Amer: >90 mL/min (ref >90)
GLUCOSE: 139 mg/dL — AB (ref 70–99)
POTASSIUM: 4 mmol/L (ref 3.5–5.1)
Sodium: 138 mmol/L (ref 135–145)

## 2015-01-23 LAB — GLUCOSE, CAPILLARY
GLUCOSE-CAPILLARY: 231 mg/dL — AB (ref 70–99)
Glucose-Capillary: 133 mg/dL — ABNORMAL HIGH (ref 70–99)
Glucose-Capillary: 185 mg/dL — ABNORMAL HIGH (ref 70–99)
Glucose-Capillary: 156 mg/dL — ABNORMAL HIGH (ref 70–99)

## 2015-01-23 LAB — HEMOGLOBIN A1C
Hgb A1c MFr Bld: 6.4 % — ABNORMAL HIGH (ref 4.8–5.6)
Mean Plasma Glucose: 137 mg/dL

## 2015-01-23 LAB — LACTIC ACID, PLASMA: Lactic Acid, Venous: 2.3 mmol/L (ref 0.5–2.0)

## 2015-01-23 MED ORDER — PREDNISONE 10 MG PO TABS: ORAL_TABLET | ORAL | Status: AC

## 2015-01-23 MED ORDER — DOXYCYCLINE HYCLATE 100 MG PO TABS: 100.0000 mg | ORAL_TABLET | Freq: Two times a day (BID) | ORAL | Status: DC

## 2015-01-23 MED ORDER — IPRATROPIUM-ALBUTEROL 0.5-2.5 (3) MG/3ML IN SOLN
3.0000 mL | Freq: Three times a day (TID) | RESPIRATORY_TRACT | Status: DC
  Administered 2015-01-24: 3 mL via RESPIRATORY_TRACT
  Filled 2015-01-23: qty 3

## 2015-01-23 MED ORDER — PREDNISONE 20 MG PO TABS
40.0000 mg | ORAL_TABLET | Freq: Two times a day (BID) | ORAL | Status: DC
  Administered 2015-01-24: 40 mg via ORAL
  Filled 2015-01-23 (×3): qty 2

## 2015-01-23 MED ORDER — LEVOFLOXACIN 500 MG PO TABS: 500.0000 mg | ORAL_TABLET | Freq: Every day | ORAL | Status: AC

## 2015-01-23 MED ORDER — ALBUTEROL SULFATE HFA 108 (90 BASE) MCG/ACT IN AERS: 2.0000 | INHALATION_SPRAY | Freq: Four times a day (QID) | RESPIRATORY_TRACT | Status: AC | PRN

## 2015-01-23 NOTE — Care Management Note (Unsigned)
    Page 1 of 1   01/23/2015     3:43:27 PM CARE MANAGEMENT NOTE 01/23/2015  Patient:  Christian Woods,Christian Woods   Account Number:  [id number]  Date Initiated:  01/23/2015  Documentation initiated by:  Letha CapeAYLOR,Ngai Parcell  Subjective/Objective Assessment:   dx gib, resp failure, chf  admit- from home     Action/Plan:   Anticipated DC Date:  01/24/2015   Anticipated DC Plan:  HOME/SELF CARE      DC Planning Services  CM consult  [facility]      Choice offered to / List presented to:             Status of service:  In process, will continue to follow Medicare Important Message given?  NO (If response is "NO", the following Medicare IM given date fields will be blank) Date Medicare IM given:   Medicare IM given by:   Date Additional Medicare IM given:   Additional Medicare IM given by:    Discharge Disposition:    Per UR Regulation:  Reviewed for med. necessity/level of care/duration of stay  If discussed at Long Length of Stay Meetings, dates discussed:    Comments:  01/23/15 1541 Letha Capeeborah Mikhi Athey RN, BSN [phone] [facility] pm.

## 2015-01-23 NOTE — Progress Notes (Signed)
PROGRESS NOTE  Christian DarterLandon Woods ZOX:[id number] DOB: 06/04/1981 DOA: 01/21/2015 PCP: No PCP Per Patient  HPI/Recap of past 24 hours: Patient is a 34 year old male with past mitral obesity and chronic tobacco abuse who was admitted on 4/5 after having several days of dry cough and dyspnea on exertion. In the emergency room chest x-ray unremarkable, but he was noted to be markedly hypoxic requiring supple and oxygen, lactic acid level I.96 and white blood cell count of 21. Patient started on nebulizers plus oxygen plus steroids plus doxycycline. Flu PCR negative.  Today, patient feeling better. He ambulated yesterday and oxygen saturations were down to 86% on room air. She was able to ambulate today and maintain good oxygen saturations at 90%. However, lactic acid level rechecked still elevated, down from 2.5 to 2.3. Patient himself feels like he is doing okay with no complaints. Since his breathing is easier.  Assessment/Plan: Principal Problem:   Sepsis causing acute respiratory failure with hypoxia secondary to bronchitis with bronchospasm: Patient is criteria with tachycardia, lactic acid level of 1.96 and markedly leukocytosis: Treating with with IV Levaquin, nebulizers, supple oxygen and steroids. Sepsis looks to be nearly resolving. Would like to keep in the hospital for one more day to ensure lactic acid level is at least below 2 and white count is near normal.    Morbid obesity: Patient meets criteria with BMI greater than 40   Tobacco abuse: Patient declines nicotine patch   Elevated troponin I level: Elevated lab level secondary to respiratory issues. No evidence of cardiac involvement    Code Status: Full code  Family Communication: Mother at the bedside  Disposition Plan: Likely home tomorrow, assuming white count near normal and lactic acid level resolved   Consultants:  None  Procedures:  None  Antibiotics:  Doxycycline 4/5-4/6  IV Levaquin  4/6-present   Objective: BP 166/91 mmHg  Pulse 104  Temp(Src) 98.8 F (37.1 C) (Oral)  Resp 20  Ht 5\' 10"  (1.778 m)  Wt 123.424 kg (272 lb 1.6 oz)  BMI 39.04 kg/m2  SpO2 92%  Intake/Output Summary (Last 24 hours) at 01/23/15 1714 Last data filed at 01/23/15 0900  Gross per 24 hour  Intake   3115 ml  Output   1200 ml  Net   1915 ml   Filed Weights   01/21/15 0259 01/21/15 1625  Weight: 122.471 kg (270 lb) 123.424 kg (272 lb 1.6 oz)    Exam:   General:  Alert and oriented 3, no acute distress  Cardiovascular: Regular rate and rhythm, S1-S2  Respiratory: Better air exchange, no wheezing  Abdomen: Soft, nontender, nondistended, positive bowel sounds  Musculoskeletal: trace pitting edema   Data Reviewed: Basic Metabolic Panel:  Recent Labs Lab 01/21/15 0323 01/22/15 0603 01/23/15 0537  NA 135 138 138  K 4.4 4.7 4.0  CL 97 104 107  CO2 34* 24 22  GLUCOSE 146* 173* 139*  BUN 8 10 13   CREATININE 0.81 0.71 0.64  CALCIUM 8.9 8.9 8.4   Liver Function Tests:  Recent Labs Lab 01/22/15 0603  AST 23  ALT 29  ALKPHOS 75  BILITOT 0.4  PROT 7.8  ALBUMIN 3.3*   No results for input(s): LIPASE, AMYLASE in the last 168 hours. No results for input(s): AMMONIA in the last 168 hours. CBC:  Recent Labs Lab 01/21/15 0323 01/22/15 0603 01/23/15 0537  WBC 21.7* 19.1* 13.9*  NEUTROABS 19.5*  --   --   HGB 15.3 14.0 12.8*  HCT 46.2 43.1  39.9  MCV 94.7 95.6 95.5  PLT 277 285 254   Cardiac Enzymes:    Recent Labs Lab 01/21/15 0323 01/21/15 1123 01/21/15 1858 01/21/15 2219  TROPONINI 0.05* 0.04* 0.03 0.03   BNP (last 3 results)  Recent Labs  01/21/15 0323  BNP 71.1    ProBNP (last 3 results) No results for input(s): PROBNP in the last 8760 hours.  CBG:  Recent Labs Lab 01/22/15 1722 01/22/15 2235 01/23/15 0740 01/23/15 1132 01/23/15 1645  GLUCAP 248* 129* 133* 185* 231*    No results found for this or any previous visit (from the  past 240 hour(s)).   Studies: Dg Chest 2 View  01/22/2015   CLINICAL DATA:  Pneumonia.  Shortness of breath.  EXAM: CHEST  2 VIEW  COMPARISON:  01/21/2015  FINDINGS: Mild enlargement of the cardiopericardial silhouette. No edema. The lungs appear clear. No pleural effusion.  IMPRESSION: 1. Mild enlargement of the cardiopericardial silhouette.   Electronically Signed   By: Gaylyn Rong M.D.   On: 01/22/2015 15:44    Scheduled Meds: . aspirin EC  81 mg Oral Daily  . dextromethorphan-guaiFENesin  1 tablet Oral BID  . heparin  5,000 Units Subcutaneous 3 times per day  . insulin aspart  0-20 Units Subcutaneous TID WC  . ipratropium-albuterol  3 mL Nebulization Q4H  . levofloxacin (LEVAQUIN) IV  750 mg Intravenous Q24H  . methylPREDNISolone (SOLU-MEDROL) injection  40 mg Intravenous 3 times per day  . nicotine  21 mg Transdermal Daily  . sodium chloride  3 mL Intravenous Q12H    Continuous Infusions: . sodium chloride 75 mL/hr at 01/22/15 2319     Time spent: 35 minutes  Hollice Espy  [facility] Pager [phone]. If 7PM-7AM, please contact night-coverage at [email or web address], password [facility] 01/23/2015, 5:14 PM  LOS: 2 days

## 2015-01-23 NOTE — Progress Notes (Signed)
Inpatient Diabetes Program Recommendations  AACE/ADA: New Consensus Statement on Inpatient Glycemic Control (2013)  Target Ranges:  Prepandial:   less than 140 mg/dL      Peak postprandial:   less than 180 mg/dL (1-2 hours)      Critically ill patients:  140 - 180 mg/dL   Reason for Assessment:  Results for Christian DarterCRADDOCK, Christian Woods (MRN [id number]) as of 01/23/2015 11:55  Ref. Range 01/22/2015 13:08 01/22/2015 17:22 01/22/2015 22:35 01/23/2015 07:40 01/23/2015 11:32  Glucose-Capillary Latest Range: 70-99 mg/dL 409227 (H) 811248 (H) 914129 (H) 133 (H) 185 (H)  Results for Christian DarterCRADDOCK, [name] (MRN [id number]) as of 01/23/2015 11:55  Ref. Range 01/22/2015 06:03  Hemoglobin A1C Latest Range: 4.8-5.6 % 6.4 (H)   Note that CBG's elevated with steroids.  A1C indicates pre-diabetes.  Agree with current regimen in the hospital.  Patient will need close follow-up with PCP regarding elevated CBG's and A1C.  Thanks, Beryl MeagerJenny Denijah Karrer, RN, BC-ADM Inpatient Diabetes Coordinator Pager [phone] (8a-5p)

## 2015-01-24 DIAGNOSIS — R0602 Shortness of breath: Secondary | ICD-10-CM

## 2015-01-24 LAB — CBC
HEMATOCRIT: 41.6 % (ref 39.0–52.0)
Hemoglobin: 13.2 g/dL (ref 13.0–17.0)
MCH: 30.2 pg (ref 26.0–34.0)
MCHC: 31.7 g/dL (ref 30.0–36.0)
MCV: 95.2 fL (ref 78.0–100.0)
Platelets: 251 10*3/uL (ref 150–400)
RBC: 4.37 MIL/uL (ref 4.22–5.81)
RDW: 13.3 % (ref 11.5–15.5)
WBC: 11.3 10*3/uL — AB (ref 4.0–10.5)

## 2015-01-24 LAB — GLUCOSE, CAPILLARY: Glucose-Capillary: 90 mg/dL (ref 70–99)

## 2015-01-24 LAB — LACTIC ACID, PLASMA: LACTIC ACID, VENOUS: 0.8 mmol/L (ref 0.5–2.0)

## 2015-01-24 MED ORDER — DOXYCYCLINE HYCLATE 100 MG PO TABS: 100.0000 mg | ORAL_TABLET | Freq: Two times a day (BID) | ORAL | Status: AC

## 2015-01-24 MED ORDER — AMLODIPINE BESYLATE 5 MG PO TABS
5.0000 mg | ORAL_TABLET | Freq: Every day | ORAL | Status: DC
  Administered 2015-01-24: 5 mg via ORAL
  Filled 2015-01-24: qty 1

## 2015-01-24 NOTE — Discharge Summary (Signed)
Physician Discharge Summary  Husain Costabile XLK:[id number] DOB: 23-Sep-1981 DOA: 01/21/2015  PCP: No PCP Per Patient  Admit date: 01/21/2015 Discharge date: 01/24/2015  Time spent: > 35 minutes  Recommendations for Outpatient Follow-up:  1. Please reassess blood pressures and consider starting patient on antihypertensive medication should low sodium diet not suffice 2. Patient will be discharged on Levaquin 3. Recommend reassessing blood glucose and possibly hemoglobin A1c  Discharge Diagnoses:  Principal Problem:   Sepsis Active Problems:   SOB (shortness of breath)   Cough   Acute bronchitis with bronchospasm   Morbid obesity   Tobacco abuse   Elevated troponin I level   Acute respiratory failure with hypoxia   Bronchitis   Discharge Condition: stable  Diet recommendation: low sodium diet  Filed Weights   01/21/15 0259 01/21/15 1625  Weight: 122.471 kg (270 lb) 123.424 kg (272 lb 1.6 oz)    History of present illness:  Patient is a 34 year old with history of tobacco abuse who presented to the hospital with dry cough shortness of breath. Patient was diagnosed with sepsis with acute respiratory failure with hypoxia secondary to bronchitis with bronchospasm.  Hospital Course:  Sepsis/bronchitis - Improving with sepsis resolving. - Will be discharged on Levaquin - Patient will also discharge a short course of oral prednisone  Hypertension - Administer dose of amlodipine prior to discharge. - Recommend low sodium diet and follow-up with primary care physician for further evaluation recommendations long-term  Hyperglycemia -Most likely secondary to prednisone use. - Patient will be sent home on prednisone taper will recommend he follow-up with primary care physician within next week for further evaluation recommendations regarding blood sugars - Blood sugar on last check was normal at 90  Chronic groin hidradenitis - General surgeon evaluated and recommended the  following: Chronic in nature, no acute un-drained abscesses, most abscesses are spontaneously draining -Doxycycline for 3 weeks to calm down the infection -Avoid popping abscesses with needles, hand hygiene, being cautions to avoid clothes contamination -Keep dressings on wounds while actively draining, change at least daily more often as needed for saturation -Wash with antibacterial soap/warm water, may need Hibiclens was 3x/week (he has this at home) -Hot compresses are okay -Establish care with PCP, may need referral to general surgery (he may follow up with our office - but he needs a PCP) or plastics for chronic care (he's waiting on insurance approval through his job) -Discussed weight loss - diet/exercise needed, may need A1C if not done recently -No further surgical recommendations  Procedures:  None  Consultations:  General surgery  Discharge Exam: Filed Vitals:   01/24/15 0624  BP: 157/101  Pulse: 87  Temp: 97.9 F (36.6 C)  Resp: 14    General: Pt in nad, alert and awake Cardiovascular: rrr, no mrg Respiratory: cta bl, mild expiratory wheezes bl, no increased wob  Discharge Instructions   Discharge Instructions    Call MD for:  difficulty breathing, headache or visual disturbances    Complete by:  As directed      Call MD for:  temperature >100.4    Complete by:  As directed      Diet - low sodium heart healthy    Complete by:  As directed      Discharge instructions    Complete by:  As directed   Please follow-up with her primary care physician for further evaluation recommendations. He'll need to have her blood pressures reassessed.  Also avoid smoking tobacco after discharge  Increase activity slowly    Complete by:  As directed           Current Discharge Medication List    START taking these medications   Details  albuterol (PROVENTIL HFA;VENTOLIN HFA) 108 (90 BASE) MCG/ACT inhaler Inhale 2 puffs into the lungs every  6 (six) hours as needed for wheezing or shortness of breath. Qty: 1 Inhaler, Refills: 2    levofloxacin (LEVAQUIN) 500 MG tablet Take 1 tablet (500 mg total) by mouth daily. Qty: 5 tablet, Refills: 0    predniSONE (DELTASONE) 10 MG tablet 40 mg po bid x 1 day, then 40mg  po daily on day 2, then 30mg  po daily on day 3, 20mg  po daily in day 4, then 10mg  on day 5 Qty: 18 tablet, Refills: 0      CONTINUE these medications which have NOT CHANGED   Details  ibuprofen (ADVIL,MOTRIN) 200 MG tablet Take 400 mg by mouth every 6 (six) hours as needed for fever or moderate pain.       No Known Allergies Follow-up Information    Follow up with [facility]     On 01/27/2015.   Why:  1:45 pm for hospital follow up   Contact information:   [address] [place] [id number] [phone]      [facility]   Specialty:  General Surgery   Why:  Groin wound   Contact information:   [address] [place] [id number] [phone]       The results of significant diagnostics from this hospitalization (including imaging, microbiology, ancillary and laboratory) are listed below for reference.    Significant Diagnostic Studies: Dg Chest 2 View  01/22/2015   CLINICAL DATA:  Pneumonia.  Shortness of breath.  EXAM: CHEST  2 VIEW  COMPARISON:  01/21/2015  FINDINGS: Mild enlargement of the cardiopericardial silhouette. No edema. The lungs appear clear. No pleural effusion.  IMPRESSION: 1. Mild enlargement of the cardiopericardial silhouette.   Electronically Signed   By: Gaylyn RongWalter  Liebkemann M.D.   On: 01/22/2015 15:44   Dg Chest Portable 1 View  01/21/2015   CLINICAL DATA:  Shortness of breath, fever, tachycardia, history smoking  EXAM: PORTABLE CHEST - 1 VIEW  COMPARISON:  Portable exam 0320 hr without priors for comparison.  FINDINGS: Normal heart size, mediastinal contours, and pulmonary  vascularity.  Lungs clear.  No pleural effusion or pneumothorax.  Osseous structures unremarkable.  IMPRESSION: No acute abnormalities.   Electronically Signed   By: Ulyses SouthwardMark  Boles M.D.   On: 01/21/2015 03:32    Microbiology: No results found for this or any previous visit (from the past 240 hour(s)).   Labs: Basic Metabolic Panel:  Recent Labs Lab 01/21/15 0323 01/22/15 0603 01/23/15 0537  NA 135 138 138  K 4.4 4.7 4.0  CL 97 104 107  CO2 34* 24 22  GLUCOSE 146* 173* 139*  BUN 8 10 13   CREATININE 0.81 0.71 0.64  CALCIUM 8.9 8.9 8.4   Liver Function Tests:  Recent Labs Lab 01/22/15 0603  AST 23  ALT 29  ALKPHOS 75  BILITOT 0.4  PROT 7.8  ALBUMIN 3.3*   No results for input(s): LIPASE, AMYLASE in the last 168 hours. No results for input(s): AMMONIA in the last 168 hours. CBC:  Recent Labs Lab 01/21/15 0323 01/22/15 0603 01/23/15 0537 01/24/15 0453  WBC 21.7* 19.1* 13.9* 11.3*  NEUTROABS 19.5*  --   --   --  HGB 15.3 14.0 12.8* 13.2  HCT 46.2 43.1 39.9 41.6  MCV 94.7 95.6 95.5 95.2  PLT 277 285 254 251   Cardiac Enzymes:  Recent Labs Lab 01/21/15 0323 01/21/15 1123 01/21/15 1858 01/21/15 2219  TROPONINI 0.05* 0.04* 0.03 0.03   BNP: BNP (last 3 results)  Recent Labs  01/21/15 0323  BNP 71.1    ProBNP (last 3 results) No results for input(s): PROBNP in the last 8760 hours.  CBG:  Recent Labs Lab 01/23/15 0740 01/23/15 1132 01/23/15 1645 01/23/15 2153 01/24/15 0800  GLUCAP 133* 185* 231* 156* 90     Signed:  Penny Pia  [facility] 01/24/2015, 10:36 AM

## 2015-01-24 NOTE — Progress Notes (Signed)
Scheryl DarterLandon Elsbury to be D/C'd Home per MD order.  Discussed with the patient and all questions fully answered.  VSS, Skin clean, dry and intact without evidence of skin break down, no evidence of skin tears noted. IV catheter discontinued intact. Site without signs and symptoms of complications. Dressing and pressure applied.  An After Visit Summary was printed and given to the patient. Patient received prescription.  D/c education completed with patient/family including follow up instructions, medication list, d/c activities limitations if indicated, with other d/c instructions as indicated by MD - patient able to verbalize understanding, all questions fully answered.   Patient instructed to return to ED, call 911, or call MD for any changes in condition.   Patient escorted via WC, and D/C home via private auto.  Beckey DowningFlores, Lorelee Mclaurin F 01/24/2015 11:52 AM

## 2015-01-27 ENCOUNTER — Encounter: Payer: Self-pay | Admitting: Family Medicine

## 2015-01-27 ENCOUNTER — Ambulatory Visit: Payer: 59 | Attending: Family Medicine | Admitting: Family Medicine

## 2015-01-27 VITALS — BP 139/91 | HR 94 | Temp 98.1°F | Resp 20 | Ht 70.0 in | Wt 277.2 lb

## 2015-01-27 DIAGNOSIS — I10 Essential (primary) hypertension: Secondary | ICD-10-CM | POA: Diagnosis not present

## 2015-01-27 DIAGNOSIS — IMO0001 Reserved for inherently not codable concepts without codable children: Secondary | ICD-10-CM | POA: Insufficient documentation

## 2015-01-27 DIAGNOSIS — J209 Acute bronchitis, unspecified: Secondary | ICD-10-CM | POA: Diagnosis present

## 2015-01-27 DIAGNOSIS — R030 Elevated blood-pressure reading, without diagnosis of hypertension: Secondary | ICD-10-CM

## 2015-01-27 DIAGNOSIS — L732 Hidradenitis suppurativa: Secondary | ICD-10-CM | POA: Insufficient documentation

## 2015-01-27 NOTE — Progress Notes (Signed)
Patient hospitalized for acute bronchitis, 4/5-4/8. Pain reports no pain, no difficulty breathing. Patient has not had cigarette in 1 week.

## 2015-01-27 NOTE — Progress Notes (Signed)
Subjective:    Patient ID: Christian Woods, male    DOB: 01/19/1981, 34 y.o.   MRN: [id number]  HPI  Christian Woods had presented to [facility] with dry cough shortness of breath and was admitted between 01/21/15 and 01/24/15 and diagnosed with sepsis, bronchitis and acute respiratory failure as he had decrease ear entry and pulmonary exam however his oxygen saturation remained in the 95-96% range. He was noted to be tachycardic and had an elevated blood pressure of up to 177/100, elevated white blood cell count was also noted up to 21.7 and he did have some mild elevation in his troponins which was thought to be secondary to demand ischemia.   Chest x-ray came back normal but he was admitted and placed on nebulizer treatments, Solu-Medrol and IV antibiotics. EKG revealed sinus tachycardia, borderline prolonged QTC, and nonspecific T-wave changes with no prior EKG to compare to. His condition stabilized and he was discharged.   Interval history: He reports doing well, he is not short of breath and has 2 more days to go on his prednisone taper and one more day on his Levaquin.Marland Kitchen. He is also on doxycycline for his hydradenitis and he has no additional complaints at this time. He has not smoked cigarettes since his presentation at the hospital.     Past Medical History  Diagnosis Date  . Obesity (BMI 30-39.9)     BMI 38  . Arthritis     "right knee" (01/21/2015)     Past Surgical History  Procedure Laterality Date  . Knee cartilage surgery Right 1995    "started w/scope but had to open me up to repair meniscus"  . Ankle fracture surgery Left 2010    "put plate in"  . Knee arthroscopy Right 1996; 1997    "meniscus removal; ACL removal"  . Fracture surgery       History   Social History  . Marital Status: Single    Spouse Name: N/A  . Number of Children: N/A  . Years of Education: N/A   Occupational History  . Not on file.   Social History Main Topics  . Smoking status:  Former Smoker -- 1.50 packs/day for 13 years    Types: Cigarettes    Quit date: 01/20/2015  . Smokeless tobacco: Former NeurosurgeonUser  . Alcohol Use: 21.6 oz/week    36 Cans of beer per week  . Drug Use: No  . Sexual Activity: Yes   Other Topics Concern  . Not on file   Social History Narrative     No Known Allergies   Current Outpatient Prescriptions on File Prior to Visit  Medication Sig Dispense Refill  . albuterol (PROVENTIL HFA;VENTOLIN HFA) 108 (90 BASE) MCG/ACT inhaler Inhale 2 puffs into the lungs every 6 (six) hours as needed for wheezing or shortness of breath. 1 Inhaler 2  . doxycycline (VIBRA-TABS) 100 MG tablet Take 1 tablet (100 mg total) by mouth 2 (two) times daily. 40 tablet 0  . ibuprofen (ADVIL,MOTRIN) 200 MG tablet Take 400 mg by mouth every 6 (six) hours as needed for fever or moderate pain.    Marland Kitchen. levofloxacin (LEVAQUIN) 500 MG tablet Take 1 tablet (500 mg total) by mouth daily. 5 tablet 0  . predniSONE (DELTASONE) 10 MG tablet 40 mg po bid x 1 day, then 40mg  po daily on day 2, then 30mg  po daily on day 3, 20mg  po daily in day 4, then 10mg  on day 5 18 tablet 0  No current facility-administered medications on file prior to visit.       Review of Systems  Constitutional: Negative for activity change and appetite change.  HENT: Negative for sinus pressure and sore throat.   Eyes: Negative for visual disturbance.  Respiratory: Negative for chest tightness and shortness of breath.   Cardiovascular: Negative for chest pain and palpitations.  Gastrointestinal: Negative for abdominal pain and abdominal distention.  Endocrine: Negative for cold intolerance, heat intolerance and polyphagia.  Genitourinary: Negative for dysuria, frequency and difficulty urinating.  Musculoskeletal: Negative for back pain, joint swelling and arthralgias.  Skin: Negative for color change.  Neurological: Negative for dizziness, tremors and weakness.  Psychiatric/Behavioral: Negative for  suicidal ideas and behavioral problems.       Objective:  Filed Vitals:   01/27/15 1409  BP: 139/91  Pulse: 94  Temp: 98.1 F (36.7 C)  Resp: 20      Physical Exam  Constitutional: He is oriented to person, place, and time. He appears well-developed and well-nourished.  HENT:  Head: Normocephalic and atraumatic.  Right Ear: External ear normal.  Left Ear: External ear normal.  Eyes: Conjunctivae and EOM are normal. Pupils are equal, round, and reactive to light.  Neck: Normal range of motion. Neck supple. No tracheal deviation present.  Cardiovascular: Normal rate, regular rhythm and normal heart sounds.   No murmur heard. Pulmonary/Chest: Effort normal and breath sounds normal. No respiratory distress. He has no wheezes. He exhibits no tenderness.  Abdominal: Soft. Bowel sounds are normal. He exhibits no mass. There is no tenderness.  Musculoskeletal: Normal range of motion. He exhibits no edema or tenderness.  Neurological: He is alert and oriented to person, place, and time.  Skin: Skin is warm and dry.  Psychiatric: He has a normal mood and affect.   CBC Latest Ref Rng 01/24/2015 01/23/2015 01/22/2015  WBC 4.0 - 10.5 K/uL 11.3(H) 13.9(H) 19.1(H)  Hemoglobin 13.0 - 17.0 g/dL 40.9 12.8(L) 14.0  Hematocrit 39.0 - 52.0 % 41.6 39.9 43.1  Platelets 150 - 400 K/uL 251 254 285            Assessment & Plan:  34 year old male patient recently hospitalized for acute bronchitis with bronchospasm currently doing well and almost done with his prednisone taper and his Levaquin.  Acute bronchitis: No evidence of dyspnea. Advised to complete course of prednisone taper and Levaquin He remains tobacco free at this time and he has been commended.  Hydradenitis of the groin: Currently on doxycycline, no active drainage from lesions. Will be reevaluated at his next visit to determine the need for Gen. surgery referral.  Elevated blood pressure: He does have a diastolic elevation in  V have discussed lifestyle changes which she is very motivated to commands and at his next visit we will assess for effectiveness of this changes and so I am holding off on initiating antihypertensive at this time.

## 2015-01-27 NOTE — Patient Instructions (Signed)

## 2015-01-28 ENCOUNTER — Other Ambulatory Visit: Payer: Self-pay | Admitting: Family Medicine

## 2015-01-28 ENCOUNTER — Telehealth: Payer: Self-pay

## 2015-01-28 DIAGNOSIS — D72829 Elevated white blood cell count, unspecified: Secondary | ICD-10-CM

## 2015-01-28 LAB — CBC WITH DIFFERENTIAL/PLATELET
BASOS ABS: 0 10*3/uL (ref 0.0–0.1)
Basophils Relative: 0 % (ref 0–1)
Eosinophils Absolute: 0.2 10*3/uL (ref 0.0–0.7)
Eosinophils Relative: 1 % (ref 0–5)
HCT: 45.3 % (ref 39.0–52.0)
Hemoglobin: 15.1 g/dL (ref 13.0–17.0)
LYMPHS PCT: 13 % (ref 12–46)
Lymphs Abs: 2.2 10*3/uL (ref 0.7–4.0)
MCH: 30.8 pg (ref 26.0–34.0)
MCHC: 33.3 g/dL (ref 30.0–36.0)
MCV: 92.3 fL (ref 78.0–100.0)
MONO ABS: 0.8 10*3/uL (ref 0.1–1.0)
MPV: 10.8 fL (ref 8.6–12.4)
Monocytes Relative: 5 % (ref 3–12)
Neutro Abs: 13.6 10*3/uL — ABNORMAL HIGH (ref 1.7–7.7)
Neutrophils Relative %: 81 % — ABNORMAL HIGH (ref 43–77)
PLATELETS: 304 10*3/uL (ref 150–400)
RBC: 4.91 MIL/uL (ref 4.22–5.81)
RDW: 13.2 % (ref 11.5–15.5)
WBC: 16.8 10*3/uL — ABNORMAL HIGH (ref 4.0–10.5)

## 2015-01-28 NOTE — Telephone Encounter (Signed)
Nurse called patient, patient verified date of birth. Patient aware of WBCs trending up. Patient agrees to come in for CBC in 1 week. Nurse will schedule appt now.

## 2015-01-28 NOTE — Progress Notes (Signed)
Leukocytosis, trending up; could be steroid induced and so i will order a follow up CBC.

## 2015-01-28 NOTE — Telephone Encounter (Signed)
-----   Message from Jaclyn ShaggyEnobong Amao, MD sent at 01/28/2015  8:30 AM EDT ----- Please inform him that his white blood cells have trended up and so I have scheduled him for a repeat CBC in 1 week thank you.

## 2015-02-04 ENCOUNTER — Ambulatory Visit: Payer: Self-pay | Attending: Family Medicine

## 2015-02-04 DIAGNOSIS — D72829 Elevated white blood cell count, unspecified: Secondary | ICD-10-CM

## 2015-02-04 LAB — CBC WITH DIFFERENTIAL/PLATELET
BASOS ABS: 0 10*3/uL (ref 0.0–0.1)
Basophils Relative: 0 % (ref 0–1)
Eosinophils Absolute: 0.4 10*3/uL (ref 0.0–0.7)
Eosinophils Relative: 4 % (ref 0–5)
HCT: 44.6 % (ref 39.0–52.0)
HEMOGLOBIN: 14.9 g/dL (ref 13.0–17.0)
LYMPHS ABS: 1.9 10*3/uL (ref 0.7–4.0)
LYMPHS PCT: 19 % (ref 12–46)
MCH: 30.5 pg (ref 26.0–34.0)
MCHC: 33.4 g/dL (ref 30.0–36.0)
MCV: 91.2 fL (ref 78.0–100.0)
MONO ABS: 0.6 10*3/uL (ref 0.1–1.0)
MONOS PCT: 6 % (ref 3–12)
MPV: 10.5 fL (ref 8.6–12.4)
Neutro Abs: 7 10*3/uL (ref 1.7–7.7)
Neutrophils Relative %: 71 % (ref 43–77)
Platelets: 253 10*3/uL (ref 150–400)
RBC: 4.89 MIL/uL (ref 4.22–5.81)
RDW: 13 % (ref 11.5–15.5)
WBC: 9.8 10*3/uL (ref 4.0–10.5)

## 2015-02-05 NOTE — Progress Notes (Signed)
Quick Note:  Please inform the patient that labs are normal. Thank you. ______ 

## 2015-02-06 ENCOUNTER — Telehealth: Payer: Self-pay

## 2015-02-06 NOTE — Telephone Encounter (Signed)
-----   Message from Jaclyn ShaggyEnobong Amao, MD sent at 02/05/2015  1:22 PM EDT ----- Please inform the patient that labs are normal. Thank you.

## 2015-02-06 NOTE — Telephone Encounter (Signed)
Nurse called patient, patient verified date of birth. Nurse advised patient labs are normal. Patient voiced understanding and has no questions at this time.

## 2015-02-17 ENCOUNTER — Ambulatory Visit: Payer: Self-pay | Admitting: Internal Medicine

## 2015-02-25 ENCOUNTER — Ambulatory Visit: Payer: 59 | Attending: Internal Medicine | Admitting: Internal Medicine

## 2015-02-25 ENCOUNTER — Encounter: Payer: Self-pay | Admitting: Internal Medicine

## 2015-02-25 VITALS — BP 138/96 | HR 96 | Temp 98.0°F | Resp 16 | Wt 281.6 lb

## 2015-02-25 DIAGNOSIS — R030 Elevated blood-pressure reading, without diagnosis of hypertension: Secondary | ICD-10-CM | POA: Insufficient documentation

## 2015-02-25 DIAGNOSIS — IMO0001 Reserved for inherently not codable concepts without codable children: Secondary | ICD-10-CM

## 2015-02-25 DIAGNOSIS — R7303 Prediabetes: Secondary | ICD-10-CM

## 2015-02-25 DIAGNOSIS — R7309 Other abnormal glucose: Secondary | ICD-10-CM | POA: Diagnosis not present

## 2015-02-25 DIAGNOSIS — L732 Hidradenitis suppurativa: Secondary | ICD-10-CM | POA: Insufficient documentation

## 2015-02-25 NOTE — Patient Instructions (Addendum)
DASH Eating Plan DASH stands for "Dietary Approaches to Stop Hypertension." The DASH eating plan is a healthy eating plan that has been shown to reduce high blood pressure (hypertension). Additional health benefits may include reducing the risk of type 2 diabetes mellitus, heart disease, and stroke. The DASH eating plan may also help with weight loss. WHAT DO I NEED TO KNOW ABOUT THE DASH EATING PLAN? For the DASH eating plan, you will follow these general guidelines:  Choose foods with a percent daily value for sodium of less than 5% (as listed on the food label).  Use salt-free seasonings or herbs instead of table salt or sea salt.  Check with your health care provider or pharmacist before using salt substitutes.  Eat lower-sodium products, often labeled as "lower sodium" or "no salt added."  Eat fresh foods.  Eat more vegetables, fruits, and low-fat dairy products.  Choose whole grains. Look for the word "whole" as the first word in the ingredient list.  Choose fish and skinless chicken or turkey more often than red meat. Limit fish, poultry, and meat to 6 oz (170 g) each day.  Limit sweets, desserts, sugars, and sugary drinks.  Choose heart-healthy fats.  Limit cheese to 1 oz (28 g) per day.  Eat more home-cooked food and less restaurant, buffet, and fast food.  Limit fried foods.  Cook foods using methods other than frying.  Limit canned vegetables. If you do use them, rinse them well to decrease the sodium.  When eating at a restaurant, ask that your food be prepared with less salt, or no salt if possible. WHAT FOODS CAN I EAT? Seek help from a dietitian for individual calorie needs. Grains Whole grain or whole wheat bread. Brown rice. Whole grain or whole wheat pasta. Quinoa, bulgur, and whole grain cereals. Low-sodium cereals. Corn or whole wheat flour tortillas. Whole grain cornbread. Whole grain crackers. Low-sodium crackers. Vegetables Fresh or frozen vegetables  (raw, steamed, roasted, or grilled). Low-sodium or reduced-sodium tomato and vegetable juices. Low-sodium or reduced-sodium tomato sauce and paste. Low-sodium or reduced-sodium canned vegetables.  Fruits All fresh, canned (in natural juice), or frozen fruits. Meat and Other Protein Products Ground beef (85% or leaner), grass-fed beef, or beef trimmed of fat. Skinless chicken or turkey. Ground chicken or turkey. Pork trimmed of fat. All fish and seafood. Eggs. Dried beans, peas, or lentils. Unsalted nuts and seeds. Unsalted canned beans. Dairy Low-fat dairy products, such as skim or 1% milk, 2% or reduced-fat cheeses, low-fat ricotta or cottage cheese, or plain low-fat yogurt. Low-sodium or reduced-sodium cheeses. Fats and Oils Tub margarines without trans fats. Light or reduced-fat mayonnaise and salad dressings (reduced sodium). Avocado. Safflower, olive, or canola oils. Natural peanut or almond butter. Other Unsalted popcorn and pretzels. The items listed above may not be a complete list of recommended foods or beverages. Contact your dietitian for more options. WHAT FOODS ARE NOT RECOMMENDED? Grains White bread. White pasta. White rice. Refined cornbread. Bagels and croissants. Crackers that contain trans fat. Vegetables Creamed or fried vegetables. Vegetables in a cheese sauce. Regular canned vegetables. Regular canned tomato sauce and paste. Regular tomato and vegetable juices. Fruits Dried fruits. Canned fruit in light or heavy syrup. Fruit juice. Meat and Other Protein Products Fatty cuts of meat. Ribs, chicken wings, bacon, sausage, bologna, salami, chitterlings, fatback, hot dogs, bratwurst, and packaged luncheon meats. Salted nuts and seeds. Canned beans with salt. Dairy Whole or 2% milk, cream, half-and-half, and cream cheese. Whole-fat or sweetened yogurt. Full-fat   cheeses or blue cheese. Nondairy creamers and whipped toppings. Processed cheese, cheese spreads, or cheese  curds. Condiments Onion and garlic salt, seasoned salt, table salt, and sea salt. Canned and packaged gravies. Worcestershire sauce. Tartar sauce. Barbecue sauce. Teriyaki sauce. Soy sauce, including reduced sodium. Steak sauce. Fish sauce. Oyster sauce. Cocktail sauce. Horseradish. Ketchup and mustard. Meat flavorings and tenderizers. Bouillon cubes. Hot sauce. Tabasco sauce. Marinades. Taco seasonings. Relishes. Fats and Oils Butter, stick margarine, lard, shortening, ghee, and bacon fat. Coconut, palm kernel, or palm oils. Regular salad dressings. Other Pickles and olives. Salted popcorn and pretzels. The items listed above may not be a complete list of foods and beverages to avoid. Contact your dietitian for more information. WHERE CAN I FIND MORE INFORMATION? National Heart, Lung, and Blood Institute: www.nhlbi.nih.gov/health/health-topics/topics/dash/ Document Released: 09/23/2011 Document Revised: 02/18/2014 Document Reviewed: 08/08/2013 ExitCare Patient Information 2015 [employer]. This information is not intended to replace advice given to you by your health care provider. Make sure you discuss any questions you have with your health care provider. Diabetes Mellitus and Food It is important for you to manage your blood sugar (glucose) level. Your blood glucose level can be greatly affected by what you eat. Eating healthier foods in the appropriate amounts throughout the day at about the same time each day will help you control your blood glucose level. It can also help slow or prevent worsening of your diabetes mellitus. Healthy eating may even help you improve the level of your blood pressure and reach or maintain a healthy weight.  HOW CAN FOOD AFFECT ME? Carbohydrates Carbohydrates affect your blood glucose level more than any other type of food. Your dietitian will help you determine how many carbohydrates to eat at each meal and teach you how to count carbohydrates. Counting  carbohydrates is important to keep your blood glucose at a healthy level, especially if you are using insulin or taking certain medicines for diabetes mellitus. Alcohol Alcohol can cause sudden decreases in blood glucose (hypoglycemia), especially if you use insulin or take certain medicines for diabetes mellitus. Hypoglycemia can be a life-threatening condition. Symptoms of hypoglycemia (sleepiness, dizziness, and disorientation) are similar to symptoms of having too much alcohol.  If your health care provider has given you approval to drink alcohol, do so in moderation and use the following guidelines:  Women should not have more than one drink per day, and men should not have more than two drinks per day. One drink is equal to:  12 oz of beer.  5 oz of wine.  1 oz of hard liquor.  Do not drink on an empty stomach.  Keep yourself hydrated. Have water, diet soda, or unsweetened iced tea.  Regular soda, juice, and other mixers might contain a lot of carbohydrates and should be counted. WHAT FOODS ARE NOT RECOMMENDED? As you make food choices, it is important to remember that all foods are not the same. Some foods have fewer nutrients per serving than other foods, even though they might have the same number of calories or carbohydrates. It is difficult to get your body what it needs when you eat foods with fewer nutrients. Examples of foods that you should avoid that are high in calories and carbohydrates but low in nutrients include:  Trans fats (most processed foods list trans fats on the Nutrition Facts label).  Regular soda.  Juice.  Candy.  Sweets, such as cake, pie, doughnuts, and cookies.  Fried foods. WHAT FOODS CAN I EAT? Have nutrient-rich foods,   which will nourish your body and keep you healthy. The food you should eat also will depend on several factors, including:  The calories you need.  The medicines you take.  Your weight.  Your blood glucose level.  Your  blood pressure level.  Your cholesterol level. You also should eat a variety of foods, including:  Protein, such as meat, poultry, fish, tofu, nuts, and seeds (lean animal proteins are best).  Fruits.  Vegetables.  Dairy products, such as milk, cheese, and yogurt (low fat is best).  Breads, grains, pasta, cereal, rice, and beans.  Fats such as olive oil, trans fat-free margarine, canola oil, avocado, and olives. DOES EVERYONE WITH DIABETES MELLITUS HAVE THE SAME MEAL PLAN? Because every person with diabetes mellitus is different, there is not one meal plan that works for everyone. It is very important that you meet with a dietitian who will help you create a meal plan that is just right for you. Document Released: 07/01/2005 Document Revised: 10/09/2013 Document Reviewed: 08/31/2013 ExitCare Patient Information 2015 [employer]. This information is not intended to replace advice given to you by your health care provider. Make sure you discuss any questions you have with your health care provider. Fat and Cholesterol Control Diet Fat and cholesterol levels in your blood and organs are influenced by your diet. High levels of fat and cholesterol may lead to diseases of the heart, small and large blood vessels, gallbladder, liver, and pancreas. CONTROLLING FAT AND CHOLESTEROL WITH DIET Although exercise and lifestyle factors are important, your diet is key. That is because certain foods are known to raise cholesterol and others to lower it. The goal is to balance foods for their effect on cholesterol and more importantly, to replace saturated and trans fat with other types of fat, such as monounsaturated fat, polyunsaturated fat, and omega-3 fatty acids. On average, a person should consume no more than 15 to 17 g of saturated fat daily. Saturated and trans fats are considered "bad" fats, and they will raise LDL cholesterol. Saturated fats are primarily found in animal products such as meats,  butter, and cream. However, that does not mean you need to give up all your favorite foods. Today, there are good tasting, low-fat, low-cholesterol substitutes for most of the things you like to eat. Choose low-fat or nonfat alternatives. Choose round or loin cuts of red meat. These types of cuts are lowest in fat and cholesterol. Chicken (without the skin), fish, veal, and ground turkey breast are great choices. Eliminate fatty meats, such as hot dogs and salami. Even shellfish have little or no saturated fat. Have a 3 oz (85 g) portion when you eat lean meat, poultry, or fish. Trans fats are also called "partially hydrogenated oils." They are oils that have been scientifically manipulated so that they are solid at room temperature resulting in a longer shelf life and improved taste and texture of foods in which they are added. Trans fats are found in stick margarine, some tub margarines, cookies, crackers, and baked goods.  When baking and cooking, oils are a great substitute for butter. The monounsaturated oils are especially beneficial since it is believed they lower LDL and raise HDL. The oils you should avoid entirely are saturated tropical oils, such as coconut and palm.  Remember to eat a lot from food groups that are naturally free of saturated and trans fat, including fish, fruit, vegetables, beans, grains (barley, rice, couscous, bulgur wheat), and pasta (without cream sauces).  IDENTIFYING FOODS THAT   LOWER FAT AND CHOLESTEROL  Soluble fiber may lower your cholesterol. This type of fiber is found in fruits such as apples, vegetables such as broccoli, potatoes, and carrots, legumes such as beans, peas, and lentils, and grains such as barley. Foods fortified with plant sterols (phytosterol) may also lower cholesterol. You should eat at least 2 g per day of these foods for a cholesterol lowering effect.  Read package labels to identify low-saturated fats, trans fat free, and low-fat foods at the  supermarket. Select cheeses that have only 2 to 3 g saturated fat per ounce. Use a heart-healthy tub margarine that is free of trans fats or partially hydrogenated oil. When buying baked goods (cookies, crackers), avoid partially hydrogenated oils. Breads and muffins should be made from whole grains (whole-wheat or whole oat flour, instead of "flour" or "enriched flour"). Buy non-creamy canned soups with reduced salt and no added fats.  FOOD PREPARATION TECHNIQUES  Never deep-fry. If you must fry, either stir-fry, which uses very little fat, or use non-stick cooking sprays. When possible, broil, bake, or roast meats, and steam vegetables. Instead of putting butter or margarine on vegetables, use lemon and herbs, applesauce, and cinnamon (for squash and sweet potatoes). Use nonfat yogurt, salsa, and low-fat dressings for salads.  LOW-SATURATED FAT / LOW-FAT FOOD SUBSTITUTES Meats / Saturated Fat (g)  Avoid: Steak, marbled (3 oz/85 g) / 11 g  Choose: Steak, lean (3 oz/85 g) / 4 g  Avoid: Hamburger (3 oz/85 g) / 7 g  Choose: Hamburger, lean (3 oz/85 g) / 5 g  Avoid: Ham (3 oz/85 g) / 6 g  Choose: Ham, lean cut (3 oz/85 g) / 2.4 g  Avoid: Chicken, with skin, dark meat (3 oz/85 g) / 4 g  Choose: Chicken, skin removed, dark meat (3 oz/85 g) / 2 g  Avoid: Chicken, with skin, light meat (3 oz/85 g) / 2.5 g  Choose: Chicken, skin removed, light meat (3 oz/85 g) / 1 g Dairy / Saturated Fat (g)  Avoid: Whole milk (1 cup) / 5 g  Choose: Low-fat milk, 2% (1 cup) / 3 g  Choose: Low-fat milk, 1% (1 cup) / 1.5 g  Choose: Skim milk (1 cup) / 0.3 g  Avoid: Hard cheese (1 oz/28 g) / 6 g  Choose: Skim milk cheese (1 oz/28 g) / 2 to 3 g  Avoid: Cottage cheese, 4% fat (1 cup) / 6.5 g  Choose: Low-fat cottage cheese, 1% fat (1 cup) / 1.5 g  Avoid: Ice cream (1 cup) / 9 g  Choose: Sherbet (1 cup) / 2.5 g  Choose: Nonfat frozen yogurt (1 cup) / 0.3 g  Choose: Frozen fruit bar /  trace  Avoid: Whipped cream (1 tbs) / 3.5 g  Choose: Nondairy whipped topping (1 tbs) / 1 g Condiments / Saturated Fat (g)  Avoid: Mayonnaise (1 tbs) / 2 g  Choose: Low-fat mayonnaise (1 tbs) / 1 g  Avoid: Butter (1 tbs) / 7 g  Choose: Extra light margarine (1 tbs) / 1 g  Avoid: Coconut oil (1 tbs) / 11.8 g  Choose: Olive oil (1 tbs) / 1.8 g  Choose: Corn oil (1 tbs) / 1.7 g  Choose: Safflower oil (1 tbs) / 1.2 g  Choose: Sunflower oil (1 tbs) / 1.4 g  Choose: Soybean oil (1 tbs) / 2.4 g  Choose: Canola oil (1 tbs) / 1 g Document Released: 10/04/2005 Document Revised: 01/29/2013 Document Reviewed: 01/02/2014 ExitCare Patient Information 2015 [employer],   LL[employer]. This information is not intended to replace advice given to you by your health care provider. Make sure you discuss any questions you have with your health care provider.  

## 2015-02-25 NOTE — Progress Notes (Signed)
MRN: [id number] Name: Christian Woods  Sex: male Age: 34 y.o. DOB: 11/24/1980  Allergies: Review of patient's allergies indicates no known allergies.  Chief Complaint  Patient presents with  . Follow-up    HPI: Patient is 34 y.o. male who was last month hospitalized and was treated for bronchitis, respiratory failure, EMR reviewed her patient subsequently followed up with transitional care clinic, blood work reviewed noticed hemoglobin A1c of 6.4% patient has prediabetes, also his blood pressure is borderline elevated, he also has history of hidradenitis in the groin, as per patient it has been ongoing problem for several years patient has tried several topical antibiotic creams as well as was recently treated with doxycycline, as per patient he has seen a dermatologist in the past but never seen a surgery, he still has persistent lesions has any fever chills has occasional discharge.  Past Medical History  Diagnosis Date  . Obesity (BMI 30-39.9)     BMI 38  . Arthritis     "right knee" (01/21/2015)    Past Surgical History  Procedure Laterality Date  . Knee cartilage surgery Right 1995    "started w/scope but had to open me up to repair meniscus"  . Ankle fracture surgery Left 2010    "put plate in"  . Knee arthroscopy Right 1996; 1997    "meniscus removal; ACL removal"  . Fracture surgery        Medication List       This list is accurate as of: 02/25/15 11:59 AM.  Always use your most recent med list.               albuterol 108 (90 BASE) MCG/ACT inhaler  Commonly known as:  PROVENTIL HFA;VENTOLIN HFA  Inhale 2 puffs into the lungs every 6 (six) hours as needed for wheezing or shortness of breath.     doxycycline 100 MG tablet  Commonly known as:  VIBRA-TABS  Take 1 tablet (100 mg total) by mouth 2 (two) times daily.     ibuprofen 200 MG tablet  Commonly known as:  ADVIL,MOTRIN  Take 400 mg by mouth every 6 (six) hours as needed for fever or moderate pain.     predniSONE 10 MG tablet  Commonly known as:  DELTASONE  40 mg po bid x 1 day, then 40mg  po daily on day 2, then 30mg  po daily on day 3, 20mg  po daily in day 4, then 10mg  on day 5        No orders of the defined types were placed in this encounter.     There is no immunization history on file for this patient.  Family History  Problem Relation Age of Onset  . Breast cancer    . Hypertension Mother   . Cancer Mother   . Heart disease Maternal Aunt   . Stroke Maternal Grandmother   . Cancer Maternal Grandfather     throat cancer    History  Substance Use Topics  . Smoking status: Former Smoker -- 1.50 packs/day for 13 years    Types: Cigarettes    Quit date: 01/20/2015  . Smokeless tobacco: Former NeurosurgeonUser  . Alcohol Use: 21.6 oz/week    36 Cans of beer per week    Review of Systems   As noted in HPI  Filed Vitals:   02/25/15 0919  BP: 138/96  Pulse: 96  Temp: 98 F (36.7 C)  Resp: 16    Physical Exam  Physical Exam  Constitutional:  Obese male sitting comfortably not in acute distress  HENT:  Head: Normocephalic and atraumatic.  Eyes: EOM are normal. Pupils are equal, round, and reactive to light.  Cardiovascular: Normal rate and regular rhythm.   Pulmonary/Chest: Breath sounds normal. No respiratory distress. He has no wheezes. He has no rales.  Abdominal: Soft. There is no tenderness. There is no rebound.  Hydradenitis lesions noticed in the groin, no apparent discharge    CBC    Component Value Date/Time   WBC 9.8 02/04/2015 0945   RBC 4.89 02/04/2015 0945   HGB 14.9 02/04/2015 0945   HCT 44.6 02/04/2015 0945   PLT 253 02/04/2015 0945   MCV 91.2 02/04/2015 0945   LYMPHSABS 1.9 02/04/2015 0945   MONOABS 0.6 02/04/2015 0945   EOSABS 0.4 02/04/2015 0945   BASOSABS 0.0 02/04/2015 0945    CMP     Component Value Date/Time   NA 138 01/23/2015 0537   K 4.0 01/23/2015 0537   CL 107 01/23/2015 0537   CO2 22 01/23/2015 0537   GLUCOSE 139*  01/23/2015 0537   BUN 13 01/23/2015 0537   CREATININE 0.64 01/23/2015 0537   CALCIUM 8.4 01/23/2015 0537   PROT 7.8 01/22/2015 0603   ALBUMIN 3.3* 01/22/2015 0603   AST 23 01/22/2015 0603   ALT 29 01/22/2015 0603   ALKPHOS 75 01/22/2015 0603   BILITOT 0.4 01/22/2015 0603   GFRNONAA >90 01/23/2015 0537   GFRAA >90 01/23/2015 0537    No results found for: CHOL  Lab Results  Component Value Date/Time   HGBA1C 6.4* 01/22/2015 06:03 AM    Lab Results  Component Value Date/Time   AST 23 01/22/2015 06:03 AM    Assessment and Plan  Morbid obesity/Elevated blood pressure/Prediabetes  Patient likely has some metabolic syndrome, I have discussed with the patient regarding lifestyle modification, diet modification, exercise, also offered  starting patient on metformin since his last hemoglobin A1c is 6.4%, patient wants to hold off any medications and wants to try lifestyle changes wants to wait for 3 months, at that time we will also check his cholesterol level and determine if he needs to be in any medications.   Hydradenitis - Plan: ongoing chronic problem, patient has already tried oral antibiotics, topical creams Ambulatory referral to General Surgery   Return in about 3 months (around 05/28/2015), or if symptoms worsen or fail to improve.   This note has been created with Education officer, environmentalDragon speech recognition software and smart phrase technology. Any transcriptional errors are unintentional.    Doris CheadleADVANI, Mariyam Remington, MD

## 2015-02-25 NOTE — Progress Notes (Signed)
Patient here for follow up Patient was recently in the hospital for bronchitis Patient also has a history of boils and right now he has them On his pants line of his abd  The boils are open and not healing Currently on no prescribed medications

## 2015-02-28 ENCOUNTER — Telehealth: Payer: Self-pay

## 2015-02-28 NOTE — Telephone Encounter (Signed)
Returned patient phone call Patient had called to check on his referral  Patient was informed it could take up to ten days

## 2015-04-15 ENCOUNTER — Other Ambulatory Visit: Payer: Self-pay | Admitting: Surgery

## 2015-10-12 IMAGING — DX DG CHEST 2V
2 series · 2 of 2 positions shown · non-contrast
Comparison: 01/21/2015

CLINICAL DATA: Pneumonia.  Shortness of breath.

EXAM:
CHEST  2 VIEW

[chest pa]
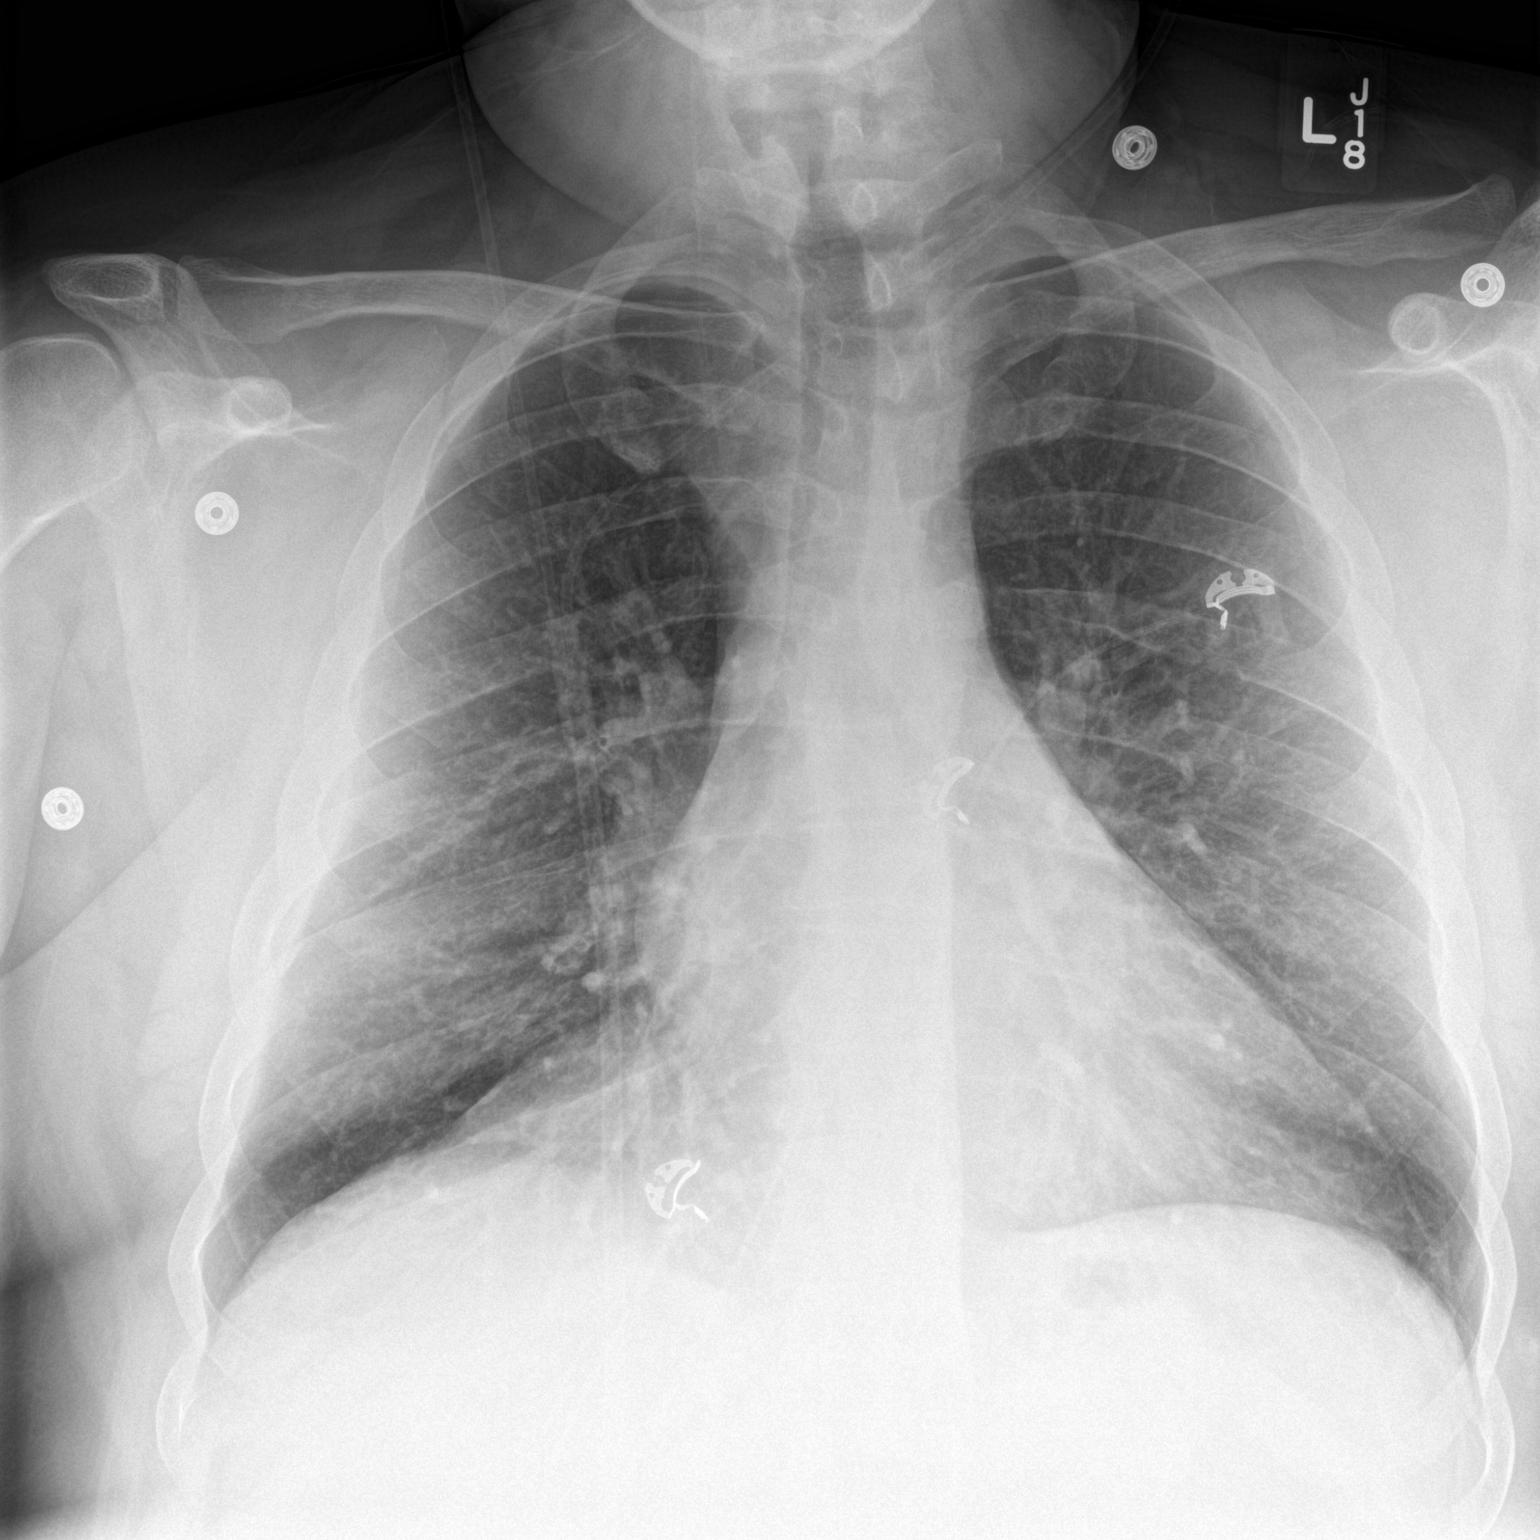

[chest lat]
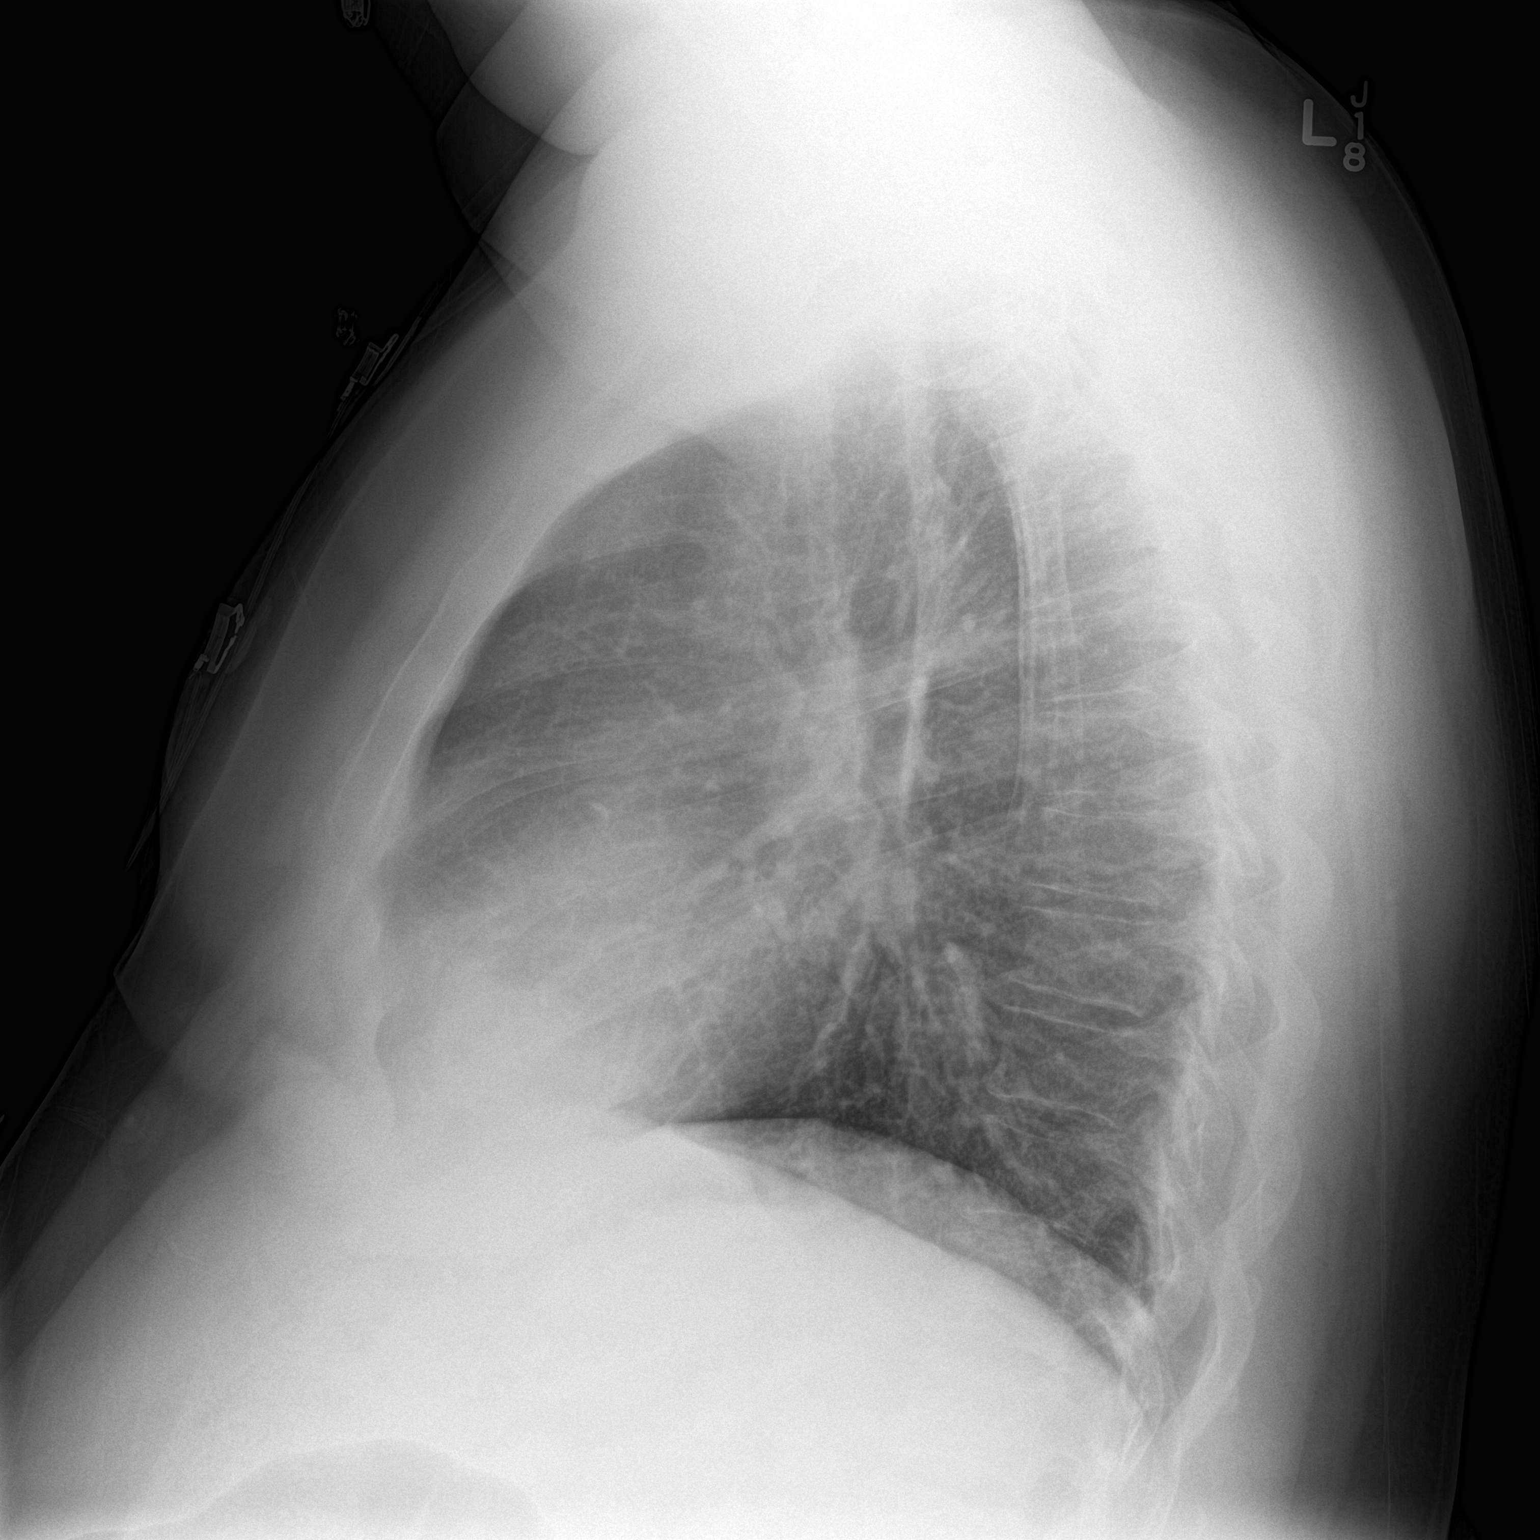

[2 of 2 positions shown; findings below may reference images not displayed]

FINDINGS: Mild enlargement of the cardiopericardial silhouette. No edema. The
lungs appear clear. No pleural effusion.
IMPRESSION: 1. Mild enlargement of the cardiopericardial silhouette.

## 2023-12-20 ENCOUNTER — Encounter (HOSPITAL_COMMUNITY): Payer: Self-pay

## 2023-12-20 ENCOUNTER — Other Ambulatory Visit: Payer: Self-pay

## 2023-12-20 ENCOUNTER — Emergency Department (HOSPITAL_COMMUNITY)

## 2023-12-20 ENCOUNTER — Emergency Department (HOSPITAL_COMMUNITY)
Admission: EM | Admit: 2023-12-20 | Discharge: 2023-12-20 | Disposition: A | Discharge status: Home / Self Care | Attending: Emergency Medicine | Admitting: Emergency Medicine

## 2023-12-20 DIAGNOSIS — I10 Essential (primary) hypertension: Secondary | ICD-10-CM | POA: Insufficient documentation

## 2023-12-20 DIAGNOSIS — R0602 Shortness of breath: Secondary | ICD-10-CM | POA: Diagnosis not present

## 2023-12-20 DIAGNOSIS — R1011 Right upper quadrant pain: Secondary | ICD-10-CM | POA: Insufficient documentation

## 2023-12-20 DIAGNOSIS — R0781 Pleurodynia: Secondary | ICD-10-CM

## 2023-12-20 LAB — COMPREHENSIVE METABOLIC PANEL
ALT: 40 U/L (ref 0–44)
AST: 32 U/L (ref 15–41)
Albumin: 4 g/dL (ref 3.5–5.0)
Alkaline Phosphatase: 109 U/L (ref 38–126)
Anion gap: 11 (ref 5–15)
BUN: 7 mg/dL (ref 6–20)
CO2: 23 mmol/L (ref 22–32)
Calcium: 9.3 mg/dL (ref 8.9–10.3)
Chloride: 100 mmol/L (ref 98–111)
Creatinine, Ser: 0.83 mg/dL (ref 0.61–1.24)
GFR, Estimated: >60 mL/min (ref >60)
Glucose, Bld: 107 mg/dL — ABNORMAL HIGH (ref 70–99)
Potassium: 3.9 mmol/L (ref 3.5–5.1)
Sodium: 134 mmol/L — ABNORMAL LOW (ref 135–145)
Total Bilirubin: 0.6 mg/dL (ref 0.0–1.2)
Total Protein: 8.5 g/dL — ABNORMAL HIGH (ref 6.5–8.1)

## 2023-12-20 LAB — URINALYSIS, ROUTINE W REFLEX MICROSCOPIC
Bilirubin Urine: NEGATIVE
Glucose, UA: NEGATIVE mg/dL
Hgb urine dipstick: NEGATIVE
Ketones, ur: NEGATIVE mg/dL
Leukocytes,Ua: NEGATIVE
Nitrite: NEGATIVE
Protein, ur: NEGATIVE mg/dL
Specific Gravity, Urine: 1.001 — ABNORMAL LOW (ref 1.005–1.030)
pH: 6 (ref 5.0–8.0)

## 2023-12-20 LAB — CBC WITH DIFFERENTIAL/PLATELET
Abs Immature Granulocytes: 0.04 10*3/uL (ref 0.00–0.07)
Basophils Absolute: 0.1 10*3/uL (ref 0.0–0.1)
Basophils Relative: 1 %
Eosinophils Absolute: 0.1 10*3/uL (ref 0.0–0.5)
Eosinophils Relative: 1 %
HCT: 49.6 % (ref 39.0–52.0)
Hemoglobin: 15.9 g/dL (ref 13.0–17.0)
Immature Granulocytes: 0 %
Lymphocytes Relative: 18 %
Lymphs Abs: 1.9 10*3/uL (ref 0.7–4.0)
MCH: 31.2 pg (ref 26.0–34.0)
MCHC: 32.1 g/dL (ref 30.0–36.0)
MCV: 97.3 fL (ref 80.0–100.0)
Monocytes Absolute: 0.6 10*3/uL (ref 0.1–1.0)
Monocytes Relative: 6 %
Neutro Abs: 7.9 10*3/uL — ABNORMAL HIGH (ref 1.7–7.7)
Neutrophils Relative %: 74 %
Platelets: 265 10*3/uL (ref 150–400)
RBC: 5.1 MIL/uL (ref 4.22–5.81)
RDW: 14.7 % (ref 11.5–15.5)
WBC: 10.5 10*3/uL (ref 4.0–10.5)
nRBC: 0 % (ref 0.0–0.2)

## 2023-12-20 LAB — TROPONIN I (HIGH SENSITIVITY)
Troponin I (High Sensitivity): 5 ng/L (ref <18)
Troponin I (High Sensitivity): 5 ng/L (ref <18)

## 2023-12-20 LAB — LIPASE, BLOOD: Lipase: 33 U/L (ref 11–51)

## 2023-12-20 MED ORDER — DIAZEPAM 5 MG PO TABS: 5.0000 mg | ORAL_TABLET | Freq: Four times a day (QID) | ORAL | 0 refills | Status: AC | PRN

## 2023-12-20 MED ORDER — HYDROMORPHONE HCL 1 MG/ML IJ SOLN
1.0000 mg | Freq: Once | INTRAMUSCULAR | Status: AC
  Administered 2023-12-20: 1 mg via INTRAVENOUS
  Filled 2023-12-20: qty 1

## 2023-12-20 MED ORDER — MORPHINE SULFATE (PF) 4 MG/ML IV SOLN
4.0000 mg | Freq: Once | INTRAVENOUS | Status: AC
  Administered 2023-12-20: 4 mg via INTRAVENOUS
  Filled 2023-12-20: qty 1

## 2023-12-20 MED ORDER — KETOROLAC TROMETHAMINE 30 MG/ML IJ SOLN
30.0000 mg | Freq: Once | INTRAMUSCULAR | Status: AC
  Administered 2023-12-20: 30 mg via INTRAVENOUS
  Filled 2023-12-20: qty 1

## 2023-12-20 MED ORDER — IOHEXOL 350 MG/ML SOLN
100.0000 mL | Freq: Once | INTRAVENOUS | Status: AC | PRN
  Administered 2023-12-20: 100 mL via INTRAVENOUS

## 2023-12-20 MED ORDER — HYDRALAZINE HCL 20 MG/ML IJ SOLN
10.0000 mg | Freq: Once | INTRAMUSCULAR | Status: AC
  Administered 2023-12-20: 10 mg via INTRAVENOUS
  Filled 2023-12-20: qty 1

## 2023-12-20 MED ORDER — OXYCODONE-ACETAMINOPHEN 5-325 MG PO TABS: 1.0000 | ORAL_TABLET | Freq: Four times a day (QID) | ORAL | 0 refills | Status: AC | PRN

## 2023-12-20 MED ORDER — IBUPROFEN 800 MG PO TABS: 800.0000 mg | ORAL_TABLET | Freq: Three times a day (TID) | ORAL | 0 refills | Status: DC

## 2023-12-20 NOTE — ED Triage Notes (Addendum)
 Pt reports with sharp RUQ abdominal pain x 1 week. Pt also reports shob when walking up the stairs. Pt reports being out of his bp meds x 1 week but has it sorted out now.

## 2023-12-20 NOTE — Discharge Instructions (Addendum)
 As we discussed, your CT scan was unremarkable today.  You may have muscle strain from coughing.  You should take ibuprofen 800 mg every 8 hours for pain.  If you have severe pain please take Percocet as prescribed.  You can also take Valium for muscle spasm  Please rest for several days  If you have persistent pain, please follow-up with GI doctor.  I have referred you to GI doctor  Return to ER if you have worse abdominal pain or vomiting or trouble breathing

## 2023-12-20 NOTE — ED Provider Notes (Signed)
 [facility] Provider Note   CSN: [id number] Arrival date & time: 12/20/23  1438     History  Chief Complaint  Patient presents with   Abdominal Pain    Christian Woods is a 43 y.o. male  otherwise healthy here presenting with abdominal pain.  Patient has been having abdominal pain for the last week or so.  He states that his right upper quadrant and worse when she takes a deep breath.  Patient states that he also ran out of his blood pressure medicine.  He went to PCP office and was noted to be hypertensive.  PCP was concern for possible biliary colic versus PE.  Denies any recent travel or chest pain.  The history is provided by the patient.       Home Medications Prior to Admission medications   Medication Sig Start Date End Date Taking? Authorizing Provider  albuterol (PROVENTIL HFA;VENTOLIN HFA) 108 (90 BASE) MCG/ACT inhaler Inhale 2 puffs into the lungs every 6 (six) hours as needed for wheezing or shortness of breath. 01/23/15   Hollice Espy, MD  doxycycline (VIBRA-TABS) 100 MG tablet Take 1 tablet (100 mg total) by mouth 2 (two) times daily. 01/24/15   Penny Pia, MD  ibuprofen (ADVIL,MOTRIN) 200 MG tablet Take 400 mg by mouth every 6 (six) hours as needed for fever or moderate pain.    [provider]  predniSONE (DELTASONE) 10 MG tablet 40 mg po bid x 1 day, then 40mg  po daily on day 2, then 30mg  po daily on day 3, 20mg  po daily in day 4, then 10mg  on day 5 01/23/15   Hollice Espy, MD      Allergies    Patient has no known allergies.    Review of Systems   Review of Systems  Gastrointestinal:  Positive for abdominal pain.  All other systems reviewed and are negative.   Physical Exam Updated Vital Signs BP (!) 177/94   Pulse 99   Temp 98.3 F (36.8 C) (Oral)   Resp 17   Ht 5\' 9"  (1.753 m)   Wt 136.1 kg   SpO2 92%   BMI 44.30 kg/m  Physical Exam Vitals and nursing note reviewed.   Constitutional:      Comments: Uncomfortable  HENT:     Head: Normocephalic.     Mouth/Throat:     Mouth: Mucous membranes are moist.  Eyes:     Extraocular Movements: Extraocular movements intact.     Pupils: Pupils are equal, round, and reactive to light.  Cardiovascular:     Rate and Rhythm: Normal rate and regular rhythm.     Heart sounds: Normal heart sounds.  Pulmonary:     Effort: Pulmonary effort is normal.     Breath sounds: Normal breath sounds.  Abdominal:     Comments: Mild right upper quadrant tenderness.  Skin:    General: Skin is warm.     Capillary Refill: Capillary refill takes less than 2 seconds.  Neurological:     General: No focal deficit present.     Mental Status: He is oriented to person, place, and time.  Psychiatric:        Behavior: Behavior normal.     ED Results / Procedures / Treatments   Labs (all labs ordered are listed, but only abnormal results are displayed) Labs Reviewed  COMPREHENSIVE METABOLIC PANEL - Abnormal; Notable for the following components:      Result Value  Sodium 134 (*)    Glucose, Bld 107 (*)    Total Protein 8.5 (*)    All other components within normal limits  CBC WITH DIFFERENTIAL/PLATELET - Abnormal; Notable for the following components:   Neutro Abs 7.9 (*)    All other components within normal limits  URINALYSIS, ROUTINE W REFLEX MICROSCOPIC - Abnormal; Notable for the following components:   Color, Urine COLORLESS (*)    Specific Gravity, Urine 1.001 (*)    All other components within normal limits  LIPASE, BLOOD  TROPONIN I (HIGH SENSITIVITY)  TROPONIN I (HIGH SENSITIVITY)    EKG EKG Interpretation Date/Time:  Tuesday December 20 2023 15:40:08 EST Ventricular Rate:  88 PR Interval:  130 QRS Duration:  91 QT Interval:  336 QTC Calculation: 407 R Axis:   69  Text Interpretation: Sinus rhythm Probable left atrial enlargement Borderline T abnormalities, lateral leads Baseline wander in lead(s) II V2 V3  No significant change since last tracing Confirmed by Richardean Canal [phone]) on 12/20/2023 6:30:34 PM  Radiology CT Angio Chest PE W and/or Wo Contrast Result Date: 12/20/2023 CLINICAL DATA:  Chest and abdominal pain, initial encounter EXAM: CT ANGIOGRAPHY CHEST CT ABDOMEN AND PELVIS WITH CONTRAST TECHNIQUE: Multidetector CT imaging of the chest was performed using the standard protocol during bolus administration of intravenous contrast. Multiplanar CT image reconstructions and MIPs were obtained to evaluate the vascular anatomy. Multidetector CT imaging of the abdomen and pelvis was performed using the standard protocol during bolus administration of intravenous contrast. RADIATION DOSE REDUCTION: This exam was performed according to the departmental dose-optimization program which includes automated exposure control, adjustment of the mA and/or kV according to patient size and/or use of iterative reconstruction technique. CONTRAST:  OMNIPAQUE IOHEXOL 350 MG/ML SOLN COMPARISON:  None Available. FINDINGS: CTA CHEST FINDINGS Cardiovascular: Thoracic aorta and its branches show no aneurysmal dilatation or dissection. The heart is not significantly enlarged in size. No significant coronary calcifications are noted. The pulmonary artery shows a normal branching pattern bilaterally. No intraluminal filling defect is identified to suggest pulmonary embolism. Mediastinum/Nodes: Thoracic inlet is within normal limits. No hilar or mediastinal adenopathy is noted. The esophagus as visualized is within normal limits. Lungs/Pleura: Scattered nodular densities are noted along the fissures bilaterally. These are not significant by size criteria and no further follow-up is recommended. No focal infiltrate or sizable effusion is seen. Musculoskeletal: No acute bony abnormality is noted. Review of the MIP images confirms the above findings. CT ABDOMEN and PELVIS FINDINGS Hepatobiliary: Fatty infiltration of the liver is noted.  The gallbladder is within normal limits. Pancreas: Unremarkable. No pancreatic ductal dilatation or surrounding inflammatory changes. Spleen: Normal in size without focal abnormality. Adrenals/Urinary Tract: Adrenal glands are within normal limits. Kidneys are well visualized bilaterally. No renal calculi or obstructive changes are seen. The bladder is well distended. Stomach/Bowel: No obstructive or inflammatory changes of the colon are noted. The appendix is within normal limits. Stomach and small bowel show no acute abnormality. Vascular/Lymphatic: No significant vascular findings are present. No enlarged abdominal or pelvic lymph nodes. Reproductive: Prostate is unremarkable. Other: No abdominal wall hernia or abnormality. No abdominopelvic ascites. Musculoskeletal: Bilateral hip replacements are noted. No acute bony abnormality is seen. Review of the MIP images confirms the above findings. IMPRESSION: CTA of the chest: No evidence of pulmonary embolism. Scattered Peri fissural nodules. No further follow-up is recommended. CT of the abdomen and pelvis: Fatty infiltration of the liver. No acute abnormality noted. Electronically Signed  By: Alcide Clever M.D.   On: 12/20/2023 21:26   CT ABDOMEN PELVIS W CONTRAST Result Date: 12/20/2023 CLINICAL DATA:  Chest and abdominal pain, initial encounter EXAM: CT ANGIOGRAPHY CHEST CT ABDOMEN AND PELVIS WITH CONTRAST TECHNIQUE: Multidetector CT imaging of the chest was performed using the standard protocol during bolus administration of intravenous contrast. Multiplanar CT image reconstructions and MIPs were obtained to evaluate the vascular anatomy. Multidetector CT imaging of the abdomen and pelvis was performed using the standard protocol during bolus administration of intravenous contrast. RADIATION DOSE REDUCTION: This exam was performed according to the departmental dose-optimization program which includes automated exposure control, adjustment of the mA and/or kV  according to patient size and/or use of iterative reconstruction technique. CONTRAST:  OMNIPAQUE IOHEXOL 350 MG/ML SOLN COMPARISON:  None Available. FINDINGS: CTA CHEST FINDINGS Cardiovascular: Thoracic aorta and its branches show no aneurysmal dilatation or dissection. The heart is not significantly enlarged in size. No significant coronary calcifications are noted. The pulmonary artery shows a normal branching pattern bilaterally. No intraluminal filling defect is identified to suggest pulmonary embolism. Mediastinum/Nodes: Thoracic inlet is within normal limits. No hilar or mediastinal adenopathy is noted. The esophagus as visualized is within normal limits. Lungs/Pleura: Scattered nodular densities are noted along the fissures bilaterally. These are not significant by size criteria and no further follow-up is recommended. No focal infiltrate or sizable effusion is seen. Musculoskeletal: No acute bony abnormality is noted. Review of the MIP images confirms the above findings. CT ABDOMEN and PELVIS FINDINGS Hepatobiliary: Fatty infiltration of the liver is noted. The gallbladder is within normal limits. Pancreas: Unremarkable. No pancreatic ductal dilatation or surrounding inflammatory changes. Spleen: Normal in size without focal abnormality. Adrenals/Urinary Tract: Adrenal glands are within normal limits. Kidneys are well visualized bilaterally. No renal calculi or obstructive changes are seen. The bladder is well distended. Stomach/Bowel: No obstructive or inflammatory changes of the colon are noted. The appendix is within normal limits. Stomach and small bowel show no acute abnormality. Vascular/Lymphatic: No significant vascular findings are present. No enlarged abdominal or pelvic lymph nodes. Reproductive: Prostate is unremarkable. Other: No abdominal wall hernia or abnormality. No abdominopelvic ascites. Musculoskeletal: Bilateral hip replacements are noted. No acute bony abnormality is seen. Review  of the MIP images confirms the above findings. IMPRESSION: CTA of the chest: No evidence of pulmonary embolism. Scattered Peri fissural nodules. No further follow-up is recommended. CT of the abdomen and pelvis: Fatty infiltration of the liver. No acute abnormality noted. Electronically Signed   By: Alcide Clever M.D.   On: 12/20/2023 21:26    Procedures Procedures    Medications Ordered in ED Medications  morphine (PF) 4 MG/ML injection 4 mg (4 mg Intravenous Given 12/20/23 1829)  iohexol (OMNIPAQUE) 350 MG/ML injection 100 mL (100 mLs Intravenous Contrast Given 12/20/23 1900)  HYDROmorphone (DILAUDID) injection 1 mg (1 mg Intravenous Given 12/20/23 1957)  hydrALAZINE (APRESOLINE) injection 10 mg (10 mg Intravenous Given 12/20/23 1958)  HYDROmorphone (DILAUDID) injection 1 mg (1 mg Intravenous Given 12/20/23 2139)  ketorolac (TORADOL) 30 MG/ML injection 30 mg (30 mg Intravenous Given 12/20/23 2135)    ED Course/ Medical Decision Making/ A&P                                 Medical Decision Making Miran Kautzman is a 43 y.o. male here presenting with right upper quadrant pain and also shortness of breath.  Concern for possible  biliary colic versus cholecystitis versus PE.  Plan to get CBC CMP and lipase and CTA chest and CT abdomen pelvis.  Will give pain medicine and reassess.  10:04 PM I reviewed patient's labs and independently interpreted imaging studies.  Labs unremarkable and CTA chest and CT abdomen pelvis unremarkable.  Patient has been coughing and wonder if he has some musculoskeletal strain.  Pain improved with pain meds.  Stable for discharge.  Will discharge home with pain medicine and muscle relaxant.  Will have him follow-up with PCP in refer to GI if he has persistent pain  Problems Addressed: Rib pain: acute illness or injury RUQ pain: acute illness or injury  Amount and/or Complexity of Data Reviewed Labs: ordered. Decision-making details documented in ED Course. Radiology:  ordered and independent interpretation performed. Decision-making details documented in ED Course.  Risk Prescription drug management.    Final Clinical Impression(s) / ED Diagnoses Final diagnoses:  None    Rx / DC Orders ED Discharge Orders     None         Charlynne Pander, MD 12/20/23 2205

## 2024-08-22 ENCOUNTER — Other Ambulatory Visit (HOSPITAL_BASED_OUTPATIENT_CLINIC_OR_DEPARTMENT_OTHER): Payer: Self-pay | Admitting: Physician Assistant

## 2024-08-22 ENCOUNTER — Other Ambulatory Visit (HOSPITAL_COMMUNITY): Payer: Self-pay | Admitting: Physician Assistant

## 2024-08-22 DIAGNOSIS — N5089 Other specified disorders of the male genital organs: Secondary | ICD-10-CM

## 2024-08-23 ENCOUNTER — Ambulatory Visit (HOSPITAL_COMMUNITY)

## 2024-08-27 ENCOUNTER — Ambulatory Visit (HOSPITAL_COMMUNITY)
Admission: RE | Admit: 2024-08-27 | Discharge: 2024-08-27 | Disposition: A | Discharge status: Home / Self Care | Source: Ambulatory Visit | Attending: Physician Assistant | Admitting: Physician Assistant

## 2024-08-27 DIAGNOSIS — N5089 Other specified disorders of the male genital organs: Secondary | ICD-10-CM | POA: Insufficient documentation

## 2024-10-17 ENCOUNTER — Encounter (HOSPITAL_BASED_OUTPATIENT_CLINIC_OR_DEPARTMENT_OTHER): Payer: Self-pay

## 2024-10-17 ENCOUNTER — Emergency Department (HOSPITAL_BASED_OUTPATIENT_CLINIC_OR_DEPARTMENT_OTHER)

## 2024-10-17 ENCOUNTER — Emergency Department (HOSPITAL_BASED_OUTPATIENT_CLINIC_OR_DEPARTMENT_OTHER)
Admission: EM | Admit: 2024-10-17 | Discharge: 2024-10-17 | Disposition: A | Discharge status: Home / Self Care | Attending: Emergency Medicine | Admitting: Emergency Medicine

## 2024-10-17 ENCOUNTER — Emergency Department (HOSPITAL_BASED_OUTPATIENT_CLINIC_OR_DEPARTMENT_OTHER): Admitting: Radiology

## 2024-10-17 ENCOUNTER — Other Ambulatory Visit: Payer: Self-pay

## 2024-10-17 DIAGNOSIS — R739 Hyperglycemia, unspecified: Secondary | ICD-10-CM | POA: Insufficient documentation

## 2024-10-17 DIAGNOSIS — N1330 Unspecified hydronephrosis: Secondary | ICD-10-CM | POA: Diagnosis not present

## 2024-10-17 DIAGNOSIS — R10A2 Flank pain, left side: Secondary | ICD-10-CM | POA: Diagnosis present

## 2024-10-17 DIAGNOSIS — N200 Calculus of kidney: Secondary | ICD-10-CM

## 2024-10-17 LAB — BASIC METABOLIC PANEL WITH GFR
Anion gap: 14 (ref 5–15)
BUN: 6 mg/dL (ref 6–20)
CO2: 23 mmol/L (ref 22–32)
Calcium: 9.1 mg/dL (ref 8.9–10.3)
Chloride: 98 mmol/L (ref 98–111)
Creatinine, Ser: 0.98 mg/dL (ref 0.61–1.24)
GFR, Estimated: >60 mL/min
Glucose, Bld: 157 mg/dL — ABNORMAL HIGH (ref 70–99)
Potassium: 4 mmol/L (ref 3.5–5.1)
Sodium: 135 mmol/L (ref 135–145)

## 2024-10-17 LAB — CBC
HCT: 45.8 % (ref 39.0–52.0)
Hemoglobin: 15.5 g/dL (ref 13.0–17.0)
MCH: 33.1 pg (ref 26.0–34.0)
MCHC: 33.8 g/dL (ref 30.0–36.0)
MCV: 97.9 fL (ref 80.0–100.0)
Platelets: 246 K/uL (ref 150–400)
RBC: 4.68 MIL/uL (ref 4.22–5.81)
RDW: 13.8 % (ref 11.5–15.5)
WBC: 8.6 K/uL (ref 4.0–10.5)
nRBC: 0 % (ref 0.0–0.2)

## 2024-10-17 LAB — URINALYSIS, MICROSCOPIC (REFLEX)

## 2024-10-17 LAB — URINALYSIS, ROUTINE W REFLEX MICROSCOPIC
Bilirubin Urine: NEGATIVE
Glucose, UA: NEGATIVE mg/dL
Ketones, ur: NEGATIVE mg/dL
Leukocytes,Ua: NEGATIVE
Nitrite: NEGATIVE
Protein, ur: 30 mg/dL — AB
Specific Gravity, Urine: >1.03 — ABNORMAL HIGH (ref 1.005–1.030)
pH: 6 (ref 5.0–8.0)

## 2024-10-17 MED ORDER — OXYCODONE-ACETAMINOPHEN 5-325 MG PO TABS
1.0000 | ORAL_TABLET | ORAL | Status: DC | PRN
  Administered 2024-10-17: 1 via ORAL
  Filled 2024-10-17: qty 1

## 2024-10-17 MED ORDER — TAMSULOSIN HCL 0.4 MG PO CAPS: 0.4000 mg | ORAL_CAPSULE | Freq: Every day | ORAL | 0 refills | Status: AC

## 2024-10-17 MED ORDER — KETOROLAC TROMETHAMINE 15 MG/ML IJ SOLN
15.0000 mg | Freq: Once | INTRAMUSCULAR | Status: AC
  Administered 2024-10-17: 15 mg via INTRAVENOUS
  Filled 2024-10-17: qty 1

## 2024-10-17 MED ORDER — KETOROLAC TROMETHAMINE 10 MG PO TABS: 10.0000 mg | ORAL_TABLET | Freq: Four times a day (QID) | ORAL | 0 refills | Status: AC | PRN

## 2024-10-17 MED ORDER — TAMSULOSIN HCL 0.4 MG PO CAPS
0.4000 mg | ORAL_CAPSULE | Freq: Once | ORAL | Status: AC
  Administered 2024-10-17: 0.4 mg via ORAL
  Filled 2024-10-17: qty 1

## 2024-10-17 MED ORDER — ONDANSETRON HCL 4 MG/2ML IJ SOLN: 4.0000 mg | Freq: Three times a day (TID) | INTRAMUSCULAR | Status: DC | PRN

## 2024-10-17 MED ORDER — FENTANYL CITRATE (PF) 50 MCG/ML IJ SOSY
50.0000 ug | PREFILLED_SYRINGE | Freq: Once | INTRAMUSCULAR | Status: AC
  Administered 2024-10-17: 50 ug via INTRAVENOUS
  Filled 2024-10-17: qty 1

## 2024-10-17 NOTE — Discharge Instructions (Addendum)
 Take the medications as prescribed.  Follow-up with the urologist to be rechecked as we discussed.

## 2024-10-17 NOTE — ED Provider Notes (Signed)
 " [facility] EMERGENCY DE[facility]MENT AT [facility] Provider Note   CSN: [id number] Arrival date & time: 10/17/24  0155     Patient presents with: Flank Pain   Christian [facility] is a 43 y.o. male.    Flank Pain     43 year old male with medical history significant for obesity presenting to the emergency department with left-sided flank pain.  The patient denies any history of kidney stones.  He states that around noon yesterday he had developed left-sided flank pain that is sharp.  He states that he initially had endorsed some Neri urgency.  No hematuria.  No fevers or chills.  Denies any nausea or vomiting or frank abdominal pain.  He has since developed severe left-sided flank pain that has persisted which prompted his presentation to the emergency department this morning.  Prior to Admission medications  Medication Sig Start Date End Date Taking? Authorizing Provider  albuterol  (PROVENTIL  HFA;VENTOLIN  HFA) 108 (90 BASE) MCG/ACT inhaler Inhale 2 puffs into the lungs every 6 (six) hours as needed for wheezing or shortness of breath. 01/23/15   Krishnan, Sendil K, MD  diazepam  (VALIUM ) 5 MG tablet Take 1 tablet (5 mg total) by mouth every 6 (six) hours as needed for anxiety (spasms). 12/20/23   Patt Alm Macho, MD  doxycycline  (VIBRA -TABS) 100 MG tablet Take 1 tablet (100 mg total) by mouth 2 (two) times daily. 01/24/15   Guido Hire, MD  ibuprofen  (ADVIL ) 800 MG tablet Take 1 tablet (800 mg total) by mouth 3 (three) times daily. 12/20/23   Patt Alm Macho, MD  oxyCODONE -acetaminophen  (PERCOCET) 5-325 MG tablet Take 1 tablet by mouth every 6 (six) hours as needed. 12/20/23   Patt Alm Macho, MD  predniSONE  (DELTASONE ) 10 MG tablet 40 mg po bid x 1 day, then 40mg  po daily on day 2, then 30mg  po daily on day 3, 20mg  po daily in day 4, then 10mg  on day 5 01/23/15   Krishnan, Sendil K, MD    Allergies: Patient has no known allergies.    Review of Systems  Genitourinary:  Positive for  flank pain.  All other systems reviewed and are negative.   Updated Vital Signs BP (!) 155/90   Pulse 96   Temp 98.6 F (37 C) (Oral)   Resp 20   SpO2 96%   Physical Exam Vitals and nursing note reviewed.  Constitutional:      General: He is not in acute distress.    Appearance: He is well-developed. He is obese.  HENT:     Head: Normocephalic and atraumatic.  Eyes:     Conjunctiva/sclera: Conjunctivae normal.  Cardiovascular:     Rate and Rhythm: Normal rate and regular rhythm.     Heart sounds: No murmur heard. Pulmonary:     Effort: Pulmonary effort is normal. No respiratory distress.     Breath sounds: Normal breath sounds.  Abdominal:     Palpations: Abdomen is soft.     Tenderness: There is no abdominal tenderness. There is left CVA tenderness.  Musculoskeletal:        General: No swelling.     Cervical back: Neck supple.  Skin:    General: Skin is warm and dry.     Capillary Refill: Capillary refill takes less than 2 seconds.  Neurological:     Mental Status: He is alert.  Psychiatric:        Mood and Affect: Mood normal.     (all labs ordered are listed, but only  abnormal results are displayed) Labs Reviewed  BASIC METABOLIC PANEL WITH GFR - Abnormal; Notable for the following components:      Result Value   Glucose, Bld 157 (*)    All other components within normal limits  CBC  URINALYSIS, ROUTINE W REFLEX MICROSCOPIC    EKG: None  Radiology: CT Renal Stone Study Result Date: 10/17/2024 EXAM: CT UROGRAM 10/17/2024 02:27:23 AM TECHNIQUE: CT of the abdomen and pelvis was performed without the administration of intravenous contrast as per CT urogram protocol. Multiplanar reformatted images as well as MIP urogram images are provided for review. Automated exposure control, iterative reconstruction, and/or weight based adjustment of the mA/kV was utilized to reduce the radiation dose to as low as reasonably achievable. COMPARISON: None available. CLINICAL  HISTORY: Abdominal/flank pain, Left-sided flank pain, urinary hesitancy. FINDINGS: LOWER CHEST: No acute abnormality. LIVER: Cirrhosis. Mild hepatomegaly. Moderate hepatic steatosis. GALLBLADDER AND BILE DUCTS: Gallbladder is unremarkable. No biliary ductal dilatation. SPLEEN: No acute abnormality. PANCREAS: No acute abnormality. ADRENAL GLANDS: No acute abnormality. KIDNEYS, URETERS AND BLADDER: Pending and mild left hydronephrosis at the level of the deep pelvis where the distal ureter is then obscured by extensive streak artifact from bilateral hip arthroplasty. The point of obstruction is not clearly identified on this examination as a result. No hydronephrosis on the right. No intrarenal or ureteral calculi within the visualized ureters. The bladder is largely obscured by the streak artifact. No perinephric or periureteral stranding. GI AND BOWEL: Appendix normal. The stomach, small bowel, and large bowel are otherwise unremarkable. There is no bowel obstruction. PERITONEUM AND RETROPERITONEUM: No ascites. No free air. VASCULATURE: Mild aortoiliac atherosclerotic calcification. No aortic aneurysm. Aorta is normal in caliber. LYMPH NODES: No lymphadenopathy. REPRODUCTIVE ORGANS: No acute abnormality. 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 Osseous structures are age appropriate. No acute bone abnormality. No lytic or blastic bone lesion. No focal soft tissue abnormality. IMPRESSION: 1. Mild left hydronephrosis, with the distal ureter obscured by streak artifact from bilateral hip arthroplasty; no intrarenal or ureteral calculi within the visualized ureters, but the point of obstruction is not clearly identified due to streak artifact. This may be better assessed with direct visualization or intravenous pyelography. 2. Cirrhosis with mild hepatomegaly and moderate hepatic steatosis. 3. Mild aortoiliac atherosclerotic calcification Electronically signed by: Dorethia Molt MD  10/17/2024 03:09 AM EST RP Workstation: [id number]     Procedures   Medications Ordered in the ED  oxyCODONE -acetaminophen  (PERCOCET/ROXICET) 5-325 MG per tablet 1 tablet (1 tablet Oral Given 10/17/24 0206)                                    Medical Decision Making Amount and/or Complexity of Data Reviewed Labs: ordered. Radiology: ordered.  Risk Prescription drug management.    43 year old male with medical history significant for obesity presenting to the emergency department with left-sided flank pain.  The patient denies any history of kidney stones.  He states that around noon yesterday he had developed left-sided flank pain that is sharp.  He states that he initially had endorsed some Neri urgency.  No hematuria.  No fevers or chills.  Denies any nausea or vomiting or frank abdominal pain.  He has since developed severe left-sided flank pain that has persisted which prompted his presentation to the emergency department this morning.  On arrival, the patient was afebrile, vitally stable, not tachycardic or tachypneic, BP 155/90,  saturating 96% on room air.  Patient presenting with left-sided flank pain, CVA tenderness on exam.  My primary concern is for nephrolithiasis, considered UTI/pyelonephritis, no evidence of rash to suggest shingles, no falls or trauma, lower concern for other acute intra-abdominal abnormality.  CT imaging was performed which showed evidence of left-sided hydronephrosis with the distal ureter obstructed by streak artifact due to the patient's bilateral hip arthroplasties. IMPRESSION:  1. Mild left hydronephrosis, with the distal ureter obscured by streak artifact  from bilateral hip arthroplasty; no intrarenal or ureteral calculi within the  visualized ureters, but the point of obstruction is not clearly identified due  to streak artifact. This may be better assessed with direct visualization or  intravenous pyelography.  2. Cirrhosis with mild  hepatomegaly and moderate hepatic steatosis.  3. Mild aortoiliac atherosclerotic calcification    Labs: CBC without a leukocytosis or anemia, BMP unremarkable, creatinine 0.98.  IV access was obtained the patient was administered IV Toradol , IV Zofran  and IV fentanyl for pain control.  I have strong suspicion for nephrolithiasis.  Spoke with Dr. Patrcia of on-call urology who recommended that we obtain KUB, likely stone however likely small stone or passed stone causing patient's symptoms.  Will plan for follow-up on urinalysis, KUB, continued pain management in the emergency department with ultimate plan for likely disposition home pending improvable in patient's pain and successful p.o. challenge.  Signout given to Dr. Randol at 0700.     Final diagnoses:  None    ED Discharge Orders     None          Jerrol Agent, MD 10/17/24 [phone]  "

## 2024-10-17 NOTE — ED Provider Notes (Signed)
 Patient initially seen by Dr. Gerlean.  Please see his note.  Patient is feeling better at this time.  Comfortable with discharge   Randol Simmonds, MD 10/17/24 [phone]

## 2024-10-17 NOTE — ED Triage Notes (Signed)
 Pt presents via POV c/o left sided flank pain. Reports woke him up out of sleep. Reports some urinary hesitancy. Denies fever
